# Patient Record
Sex: Female | Born: 1945 | Race: White | Hispanic: No | Marital: Married | Smoking: Never smoker
Health system: Southern US, Community
[De-identification: ages and names within clinical notes are randomized; demographics above are authoritative.]

## PROBLEM LIST (undated history)

## (undated) DIAGNOSIS — I82409 Acute embolism and thrombosis of unspecified deep veins of unspecified lower extremity: Secondary | ICD-10-CM

## (undated) DIAGNOSIS — I10 Essential (primary) hypertension: Secondary | ICD-10-CM

## (undated) DIAGNOSIS — K219 Gastro-esophageal reflux disease without esophagitis: Secondary | ICD-10-CM

## (undated) DIAGNOSIS — I8390 Asymptomatic varicose veins of unspecified lower extremity: Secondary | ICD-10-CM

## (undated) DIAGNOSIS — Z9889 Other specified postprocedural states: Secondary | ICD-10-CM

## (undated) DIAGNOSIS — M109 Gout, unspecified: Secondary | ICD-10-CM

## (undated) DIAGNOSIS — Z8701 Personal history of pneumonia (recurrent): Secondary | ICD-10-CM

## (undated) DIAGNOSIS — C801 Malignant (primary) neoplasm, unspecified: Secondary | ICD-10-CM

## (undated) DIAGNOSIS — R202 Paresthesia of skin: Secondary | ICD-10-CM

## (undated) DIAGNOSIS — H9313 Tinnitus, bilateral: Secondary | ICD-10-CM

## (undated) DIAGNOSIS — R200 Anesthesia of skin: Secondary | ICD-10-CM

## (undated) DIAGNOSIS — R112 Nausea with vomiting, unspecified: Secondary | ICD-10-CM

## (undated) DIAGNOSIS — H33009 Unspecified retinal detachment with retinal break, unspecified eye: Secondary | ICD-10-CM

## (undated) DIAGNOSIS — N23 Unspecified renal colic: Secondary | ICD-10-CM

## (undated) DIAGNOSIS — Z862 Personal history of diseases of the blood and blood-forming organs and certain disorders involving the immune mechanism: Secondary | ICD-10-CM

## (undated) DIAGNOSIS — M549 Dorsalgia, unspecified: Secondary | ICD-10-CM

## (undated) DIAGNOSIS — K649 Unspecified hemorrhoids: Secondary | ICD-10-CM

## (undated) DIAGNOSIS — Z85828 Personal history of other malignant neoplasm of skin: Secondary | ICD-10-CM

## (undated) DIAGNOSIS — M5030 Other cervical disc degeneration, unspecified cervical region: Secondary | ICD-10-CM

## (undated) DIAGNOSIS — Z87442 Personal history of urinary calculi: Secondary | ICD-10-CM

## (undated) HISTORY — DX: Unspecified renal colic: N23

## (undated) HISTORY — DX: Tinnitus, bilateral: H93.13

## (undated) HISTORY — PX: CYSTOSCOPY/RETROGRADE/URETEROSCOPY: SHX5316

## (undated) HISTORY — DX: Essential (primary) hypertension: I10

## (undated) HISTORY — PX: SKIN CANCER EXCISION: SHX779

## (undated) HISTORY — PX: TONSILLECTOMY: SUR1361

## (undated) HISTORY — DX: Personal history of other malignant neoplasm of skin: Z85.828

## (undated) HISTORY — DX: Asymptomatic varicose veins of unspecified lower extremity: I83.90

## (undated) HISTORY — DX: Other cervical disc degeneration, unspecified cervical region: M50.30

## (undated) HISTORY — DX: Unspecified retinal detachment with retinal break, unspecified eye: H33.009

## (undated) HISTORY — PX: EYE SURGERY: SHX253

## (undated) HISTORY — PX: KNEE SURGERY: SHX244

## (undated) HISTORY — PX: CHOLECYSTECTOMY: SHX55

## (undated) HISTORY — PX: OTHER SURGICAL HISTORY: SHX169

## (undated) HISTORY — PX: TUBAL LIGATION: SHX77

## (undated) HISTORY — PX: RETINAL DETACHMENT SURGERY: SHX105

## (undated) HISTORY — PX: NECK SURGERY: SHX720

## (undated) HISTORY — PX: WISDOM TOOTH EXTRACTION: SHX21

## (undated) HISTORY — DX: Dorsalgia, unspecified: M54.9

---

## 1998-08-13 ENCOUNTER — Observation Stay (HOSPITAL_COMMUNITY): Admission: RE | Admit: 1998-08-13 | Discharge: 1998-08-14 | Payer: Self-pay | Admitting: Neurosurgery

## 1999-03-14 ENCOUNTER — Ambulatory Visit (HOSPITAL_COMMUNITY): Admission: RE | Admit: 1999-03-14 | Discharge: 1999-03-14 | Payer: Self-pay | Admitting: Neurosurgery

## 1999-07-14 ENCOUNTER — Encounter: Admission: RE | Admit: 1999-07-14 | Discharge: 1999-07-14 | Payer: Self-pay | Admitting: Neurosurgery

## 1999-07-22 ENCOUNTER — Ambulatory Visit (HOSPITAL_COMMUNITY): Admission: RE | Admit: 1999-07-22 | Discharge: 1999-07-22 | Payer: Self-pay | Admitting: Neurosurgery

## 2000-02-21 ENCOUNTER — Ambulatory Visit (HOSPITAL_COMMUNITY): Admission: RE | Admit: 2000-02-21 | Discharge: 2000-02-21 | Payer: Self-pay | Admitting: *Deleted

## 2000-02-21 ENCOUNTER — Encounter: Payer: Self-pay | Admitting: *Deleted

## 2000-04-28 ENCOUNTER — Encounter: Admission: RE | Admit: 2000-04-28 | Discharge: 2000-04-28 | Payer: Self-pay | Admitting: Neurosurgery

## 2000-12-03 ENCOUNTER — Ambulatory Visit (HOSPITAL_COMMUNITY): Admission: RE | Admit: 2000-12-03 | Discharge: 2000-12-03 | Payer: Self-pay | Admitting: Neurosurgery

## 2004-04-11 ENCOUNTER — Encounter: Payer: Self-pay | Admitting: Psychiatry

## 2004-05-12 ENCOUNTER — Encounter: Payer: Self-pay | Admitting: Psychiatry

## 2004-05-30 ENCOUNTER — Ambulatory Visit (HOSPITAL_COMMUNITY): Admission: RE | Admit: 2004-05-30 | Discharge: 2004-05-30 | Payer: Self-pay | Admitting: Neurosurgery

## 2004-06-11 ENCOUNTER — Ambulatory Visit: Payer: Self-pay | Admitting: Anesthesiology

## 2004-06-18 ENCOUNTER — Ambulatory Visit: Payer: Self-pay | Admitting: Urology

## 2004-07-22 ENCOUNTER — Ambulatory Visit: Payer: Self-pay | Admitting: Unknown Physician Specialty

## 2004-09-08 ENCOUNTER — Ambulatory Visit: Payer: Self-pay | Admitting: Obstetrics and Gynecology

## 2004-11-25 ENCOUNTER — Ambulatory Visit: Payer: Self-pay | Admitting: Anesthesiology

## 2005-02-23 ENCOUNTER — Ambulatory Visit: Payer: Self-pay | Admitting: Anesthesiology

## 2005-04-07 ENCOUNTER — Encounter: Payer: Self-pay | Admitting: Unknown Physician Specialty

## 2005-04-11 ENCOUNTER — Encounter: Payer: Self-pay | Admitting: Unknown Physician Specialty

## 2005-05-12 ENCOUNTER — Encounter: Payer: Self-pay | Admitting: Unknown Physician Specialty

## 2005-12-10 ENCOUNTER — Ambulatory Visit: Payer: Self-pay | Admitting: Unknown Physician Specialty

## 2005-12-16 ENCOUNTER — Ambulatory Visit: Payer: Self-pay | Admitting: Unknown Physician Specialty

## 2006-12-20 ENCOUNTER — Ambulatory Visit: Payer: Self-pay | Admitting: Unknown Physician Specialty

## 2007-03-18 ENCOUNTER — Encounter: Admission: RE | Admit: 2007-03-18 | Discharge: 2007-03-18 | Payer: Self-pay | Admitting: Neurosurgery

## 2007-06-12 ENCOUNTER — Ambulatory Visit (HOSPITAL_COMMUNITY): Admission: RE | Admit: 2007-06-12 | Discharge: 2007-06-13 | Payer: Self-pay | Admitting: Neurosurgery

## 2008-02-01 ENCOUNTER — Ambulatory Visit: Payer: Self-pay | Admitting: Unknown Physician Specialty

## 2008-12-17 ENCOUNTER — Ambulatory Visit: Payer: Self-pay | Admitting: Family Medicine

## 2009-03-04 ENCOUNTER — Ambulatory Visit: Payer: Self-pay | Admitting: Unknown Physician Specialty

## 2009-09-25 ENCOUNTER — Ambulatory Visit: Payer: Self-pay | Admitting: Unknown Physician Specialty

## 2009-12-06 ENCOUNTER — Encounter: Admission: RE | Admit: 2009-12-06 | Discharge: 2009-12-06 | Payer: Self-pay | Admitting: Neurosurgery

## 2010-03-02 ENCOUNTER — Inpatient Hospital Stay (HOSPITAL_COMMUNITY): Admission: RE | Admit: 2010-03-02 | Discharge: 2010-03-06 | Payer: Self-pay | Admitting: Neurosurgery

## 2010-05-06 ENCOUNTER — Ambulatory Visit: Payer: Self-pay | Admitting: Unknown Physician Specialty

## 2010-07-12 HISTORY — PX: BREAST EXCISIONAL BIOPSY: SUR124

## 2010-09-04 ENCOUNTER — Ambulatory Visit: Payer: Self-pay | Admitting: Family Medicine

## 2010-09-25 LAB — DIFFERENTIAL
Basophils Absolute: 0 10*3/uL (ref 0.0–0.1)
Basophils Relative: 0 % (ref 0–1)
Eosinophils Absolute: 0.2 10*3/uL (ref 0.0–0.7)
Eosinophils Relative: 1 % (ref 0–5)
Lymphocytes Relative: 7 % — ABNORMAL LOW (ref 12–46)
Lymphs Abs: 1.1 10*3/uL (ref 0.7–4.0)
Monocytes Absolute: 1 10*3/uL (ref 0.1–1.0)
Monocytes Relative: 6 % (ref 3–12)
Neutro Abs: 13.9 10*3/uL — ABNORMAL HIGH (ref 1.7–7.7)
Neutrophils Relative %: 86 % — ABNORMAL HIGH (ref 43–77)

## 2010-09-25 LAB — BASIC METABOLIC PANEL
BUN: 10 mg/dL (ref 6–23)
BUN: 16 mg/dL (ref 6–23)
CO2: 28 mEq/L (ref 19–32)
CO2: 32 mEq/L (ref 19–32)
Calcium: 7.9 mg/dL — ABNORMAL LOW (ref 8.4–10.5)
Calcium: 9 mg/dL (ref 8.4–10.5)
Chloride: 101 mEq/L (ref 96–112)
Chloride: 99 mEq/L (ref 96–112)
Creatinine, Ser: 0.74 mg/dL (ref 0.4–1.2)
Creatinine, Ser: 0.76 mg/dL (ref 0.4–1.2)
GFR calc Af Amer: >60 mL/min (ref >60)
GFR calc Af Amer: >60 mL/min (ref >60)
GFR calc non Af Amer: >60 mL/min (ref >60)
GFR calc non Af Amer: >60 mL/min (ref >60)
Glucose, Bld: 117 mg/dL — ABNORMAL HIGH (ref 70–99)
Glucose, Bld: 99 mg/dL (ref 70–99)
Potassium: 3.5 mEq/L (ref 3.5–5.1)
Potassium: 3.7 mEq/L (ref 3.5–5.1)
Sodium: 134 mEq/L — ABNORMAL LOW (ref 135–145)
Sodium: 139 mEq/L (ref 135–145)

## 2010-09-25 LAB — CBC
HCT: 35.4 % — ABNORMAL LOW (ref 36.0–46.0)
HCT: 36.6 % (ref 36.0–46.0)
HCT: 43.5 % (ref 36.0–46.0)
Hemoglobin: 11.3 g/dL — ABNORMAL LOW (ref 12.0–15.0)
Hemoglobin: 11.8 g/dL — ABNORMAL LOW (ref 12.0–15.0)
Hemoglobin: 14.3 g/dL (ref 12.0–15.0)
MCH: 30 pg (ref 26.0–34.0)
MCH: 30 pg (ref 26.0–34.0)
MCH: 30.7 pg (ref 26.0–34.0)
MCHC: 31.9 g/dL (ref 30.0–36.0)
MCHC: 32.2 g/dL (ref 30.0–36.0)
MCHC: 32.9 g/dL (ref 30.0–36.0)
MCV: 93.1 fL (ref 78.0–100.0)
MCV: 93.3 fL (ref 78.0–100.0)
MCV: 93.9 fL (ref 78.0–100.0)
Platelets: 204 10*3/uL (ref 150–400)
Platelets: 207 10*3/uL (ref 150–400)
Platelets: 312 10*3/uL (ref 150–400)
RBC: 3.77 MIL/uL — ABNORMAL LOW (ref 3.87–5.11)
RBC: 3.93 MIL/uL (ref 3.87–5.11)
RBC: 4.66 MIL/uL (ref 3.87–5.11)
RDW: 12.7 % (ref 11.5–15.5)
RDW: 12.8 % (ref 11.5–15.5)
RDW: 13.1 % (ref 11.5–15.5)
WBC: 16.2 10*3/uL — ABNORMAL HIGH (ref 4.0–10.5)
WBC: 17.7 10*3/uL — ABNORMAL HIGH (ref 4.0–10.5)
WBC: 9.7 10*3/uL (ref 4.0–10.5)

## 2010-09-25 LAB — CULTURE, BLOOD (ROUTINE X 2): Culture: NO GROWTH

## 2010-09-25 LAB — URINE CULTURE
Colony Count: NO GROWTH
Culture  Setup Time: 201108241839
Culture: NO GROWTH

## 2010-09-25 LAB — URINALYSIS, DIPSTICK ONLY
Bilirubin Urine: NEGATIVE
Glucose, UA: NEGATIVE mg/dL
Hgb urine dipstick: NEGATIVE
Ketones, ur: NEGATIVE mg/dL
Leukocytes, UA: NEGATIVE
Nitrite: NEGATIVE
Protein, ur: NEGATIVE mg/dL
Specific Gravity, Urine: 1.008 (ref 1.005–1.030)
Urobilinogen, UA: 0.2 mg/dL (ref 0.0–1.0)
pH: 8 (ref 5.0–8.0)

## 2010-09-25 LAB — TYPE AND SCREEN
ABO/RH(D): A POS
Antibody Screen: NEGATIVE

## 2010-09-25 LAB — SURGICAL PCR SCREEN
MRSA, PCR: NEGATIVE
Staphylococcus aureus: POSITIVE — AB

## 2010-09-25 LAB — ABO/RH: ABO/RH(D): A POS

## 2010-09-28 ENCOUNTER — Ambulatory Visit: Payer: Self-pay | Admitting: Unknown Physician Specialty

## 2010-10-13 ENCOUNTER — Ambulatory Visit: Payer: Self-pay | Admitting: Surgery

## 2010-10-20 ENCOUNTER — Ambulatory Visit: Payer: Self-pay | Admitting: Surgery

## 2010-10-23 LAB — PATHOLOGY REPORT

## 2010-10-25 ENCOUNTER — Emergency Department: Payer: Self-pay | Admitting: Emergency Medicine

## 2010-11-24 NOTE — Op Note (Signed)
NAMEGIULIANA, [name]                  ACCOUNT NO.:  [id number]   MEDICAL RECORD NO.:  [id number]          PATIENT TYPE:  OIB   LOCATION:  3027                         FACILITY:  [facility]   PHYSICIAN:  Cristi Loron, M.D.DATE OF BIRTH:  05-28-1946   DATE OF PROCEDURE:  06/12/2007  DATE OF DISCHARGE:                               OPERATIVE REPORT   BRIEF HISTORY:  The patient is a 65 year old white female who I  previously performed a C4-C5 and C5-C6 anterior cervical discectomy  fusion and plating on.  The patient has had some chronic neck pain, more  recently has developed increasing neck pain and shoulder pain.  She  failed medical management, was worked up with a cervical MRI which  demonstrated the patient had spondylosis and foraminal stenosis at C3-  C4.  I discussed the various treatment options with the patient  including surgery.  The patient has weighed the risks, benefits, and  alternatives of surgery and decided to proceed with an exploration of  her C4-C5 and C5-C6 fusion, as well as a C3-C4 anterior cervical  discectomy fusion and plating.   PREOPERATIVE DIAGNOSES:  C3-C4 spondylosis, stenosis, cervical  radiculopathy and myelopathy, and cervicalgia.   POSTOPERATIVE DIAGNOSES:  C3-C4 spondylosis, stenosis, cervical  radiculopathy and myelopathy, and cervicalgia.   PROCEDURE:  Exploration of C4-C5 and C5-C6 fusion; C3-C4 extensive  anterior cervical discectomy/decompression; C3-C4 anterior interbody  arthrodesis with local morselized autograft bone and VITOSS bone-graft  extender and insertion of C3-C4 interbody prosthesis (Alphatec PEEK  interbody prosthesis); C3-C4 anterior cervical plating (Codman Slim-Loc  titanium plate and screws).   SURGEON:  Cristi Loron, MD   ASSISTANT:  Stefani Dama, MD   ANESTHESIA:  General endotracheal.   ESTIMATED BLOOD LOSS:  75 mL.   SPECIMENS:  None.   DRAINS:  One 7-mm Blake drain in the prevertebral space.   COMPLICATIONS:  None.   DESCRIPTION OF PROCEDURE:  The patient was brought to the operating room  by the anesthesia team.  General endotracheal anesthesia was induced.  The patient remained in the supine position.  A roll was placed under  her shoulders to place her neck in a slight extension.  Her anterior  cervical region was then prepared with Betadine scrub and Betadine  solution.  Sterile drapes were applied.  The area to be incised was  injected with Marcaine with epinephrine solution.  I used a scalpel to  make an incision through the patient's previous surgical incision site  scar.  I used the Metzenbaum scissors to divide the platysma muscle and  then to dissect medial to the sternocleidomastoid muscle, jugular vein,  and carotid artery.  I carefully dissected down towards the anterior  cervical spine through the previous scar tissue; I carefully identified  the esophagus retracting it medially with the handheld retractors.  I  then used the Kitner swabs to expose the scar tissue over the old  anterior cervical plate.  I then incised the scar tissue over the  anterior cervical plate to expose the cam and screws.  I unlocked the  cam,  remove the screws and removed the plate.   I then inspected the arthrodesis at C4-C5 and C5-C6; I did not see any  evidence of a pseudoarthrosis, i.e. the arthrodesis looked good.   I then used electrocautery to detach the medial border of the longus  colli muscle bilaterally from C3-C4 intervertebral space.  I then  inserted the Caspar self-retaining retractor underneath the longus colli  muscle bilaterally to provide exposure.  We incised the C3-C4  intervertebral disc and performed a partial intervertebral discectomy  using the pituitary forceps and the Carlen curettes.  We then inserted  the distraction screws at C3 and C4, distracted the interspace and then  used a high-speed drill to decorticate the vertebral endplates at C3-C4,  drilled  away the remainder of C3-C4 intervertebral disc to drill away  some posterior spondylosis and then to thin out the posterior  longitudinal ligament.  I then incised the ligament with arachnoid knife  and then removed it with the Kerrison punch undercutting the vertebral  endplates at C3-C4 decompressing the thecal sac.  I then performed the  foraminotomy about the bilateral C4 nerve roots completing the  decompression.   We now turned attention to the arthrodesis.  I used the trial spacers  and determined to use a 7-mm small Alphatec interbody prosthesis.  We  prefilled this prosthesis with a combination of local autograft bone we  obtained during the decompression, as well as VITOSS bone-graft  extender.  We then inserted this prosthesis into the distracted C3-C4  interspace and then removed the distraction screws.  There was a good  snug fit of the prosthesis in the interspace.  We then filled lateral to  the prosthesis with VITOSS completing the arthrodesis.   We now turned attention to the anterior spinal instrumentation.  I used  a high-speed drill to remove some ventral spondylosis from the vertebral  endplates at C3-C4 so that the plate would lay down flat.  We selected  the appropriate length of Codman Slim-Loc anterior cervical plate and  laid it along the anterior aspect of the vertebral bodies at C3-C4.  We  then drilled two 12-mm holes at C3 to C4 and then secured the plate to  the vertebral bodies by placing two 12-mm self-tapping screws at C3 to  C4.  We then obtained intraoperative radiographs that demonstrated good  position of the plate, screws, and interbody prosthesis.  We therefore  secured the screws and plate by locking each cam.   We then obtained hemostasis using bipolar electrocautery.  We irrigated  the wound out with bacitracin solution and then removed the retractor.  We then inspected the esophagus for any damage; there was none apparent.  I then placed a  7-mm flat Blake drain in the prevertebral space and  tunneled out through a separate stab wound.  We then reapproximated the  patient's platysma muscle with interrupted 3-0 Vicryl suture,  subcutaneous tissue with interrupted 3-0 Vicryl suture, and the skin  with Steri-Strips and Benzoin.  The wound was then coated with  bacitracin ointment.  A sterile dressing was applied.  The drapes were  removed.  The patient was subsequently extubated by the anesthesia team  and transported to the post-anesthesia care unit in a stable condition.  All sponge, instrument, and needle counts were correct at the end of  this case.      Cristi Loron, M.D.  Electronically Signed     JDJ/MEDQ  D:  06/12/2007  T:  06/13/2007  Job:  [id number]

## 2010-11-24 NOTE — Op Note (Signed)
NAMEKATERI, BALCH                  ACCOUNT NO.:  [id number]   MEDICAL RECORD NO.:  [id number]          PATIENT TYPE:  OIB   LOCATION:  3027                         FACILITY:  [facility]   PHYSICIAN:  Cristi Loron, M.D.DATE OF BIRTH:  1946-03-31   DATE OF PROCEDURE:  06/12/2007  DATE OF DISCHARGE:                               OPERATIVE REPORT   ADDENDUM:  I mistakenly stated in my operative dictation that we prepared the  patient's anterior neck with Betadine scrub and Betadine solution.  The  patient had an allergy to Betadine, and he was therefore prepared with  Hibiclens.      Cristi Loron, M.D.  Electronically Signed     JDJ/MEDQ  D:  06/12/2007  T:  06/13/2007  Job:  [id number]

## 2011-04-20 LAB — BASIC METABOLIC PANEL
BUN: 12
CO2: 29
Calcium: 9.7
Chloride: 99
Creatinine, Ser: 0.69
GFR calc Af Amer: >60
GFR calc non Af Amer: >60
Glucose, Bld: 97
Potassium: 3.8
Sodium: 137

## 2011-04-20 LAB — CBC
HCT: 41.9
Hemoglobin: 14.1
MCHC: 33.6
MCV: 91.3
Platelets: 360
RBC: 4.59
RDW: 13
WBC: 9.1

## 2011-05-18 ENCOUNTER — Ambulatory Visit: Payer: Self-pay | Admitting: Unknown Physician Specialty

## 2012-05-19 ENCOUNTER — Ambulatory Visit: Payer: Self-pay | Admitting: Obstetrics and Gynecology

## 2012-06-20 DIAGNOSIS — Z85828 Personal history of other malignant neoplasm of skin: Secondary | ICD-10-CM

## 2012-06-20 HISTORY — DX: Personal history of other malignant neoplasm of skin: Z85.828

## 2013-02-20 DIAGNOSIS — C4491 Basal cell carcinoma of skin, unspecified: Secondary | ICD-10-CM

## 2013-02-20 HISTORY — DX: Basal cell carcinoma of skin, unspecified: C44.91

## 2013-04-27 DIAGNOSIS — H33009 Unspecified retinal detachment with retinal break, unspecified eye: Secondary | ICD-10-CM

## 2013-04-27 HISTORY — DX: Unspecified retinal detachment with retinal break, unspecified eye: H33.009

## 2014-09-27 ENCOUNTER — Ambulatory Visit: Payer: Self-pay | Admitting: Unknown Physician Specialty

## 2014-11-04 LAB — SURGICAL PATHOLOGY

## 2014-11-26 DIAGNOSIS — I8390 Asymptomatic varicose veins of unspecified lower extremity: Secondary | ICD-10-CM

## 2014-11-26 DIAGNOSIS — M51379 Other intervertebral disc degeneration, lumbosacral region without mention of lumbar back pain or lower extremity pain: Secondary | ICD-10-CM | POA: Insufficient documentation

## 2014-11-26 DIAGNOSIS — I10 Essential (primary) hypertension: Secondary | ICD-10-CM

## 2014-11-26 DIAGNOSIS — M5137 Other intervertebral disc degeneration, lumbosacral region: Secondary | ICD-10-CM | POA: Insufficient documentation

## 2014-11-26 DIAGNOSIS — M5030 Other cervical disc degeneration, unspecified cervical region: Secondary | ICD-10-CM

## 2014-11-26 DIAGNOSIS — H9313 Tinnitus, bilateral: Secondary | ICD-10-CM

## 2014-11-26 DIAGNOSIS — M549 Dorsalgia, unspecified: Secondary | ICD-10-CM

## 2014-11-26 HISTORY — DX: Essential (primary) hypertension: I10

## 2014-11-26 HISTORY — DX: Tinnitus, bilateral: H93.13

## 2014-11-26 HISTORY — DX: Asymptomatic varicose veins of unspecified lower extremity: I83.90

## 2014-11-26 HISTORY — DX: Dorsalgia, unspecified: M54.9

## 2014-11-26 HISTORY — DX: Other cervical disc degeneration, unspecified cervical region: M50.30

## 2015-02-21 DIAGNOSIS — N23 Unspecified renal colic: Secondary | ICD-10-CM

## 2015-02-21 HISTORY — DX: Unspecified renal colic: N23

## 2015-03-25 ENCOUNTER — Encounter: Payer: Self-pay | Admitting: Urology

## 2015-03-25 ENCOUNTER — Ambulatory Visit (INDEPENDENT_AMBULATORY_CARE_PROVIDER_SITE_OTHER): Payer: Medicare Other | Admitting: Urology

## 2015-03-25 VITALS — BP 112/77 | HR 76 | Ht 66.0 in | Wt 168.8 lb

## 2015-03-25 DIAGNOSIS — N200 Calculus of kidney: Secondary | ICD-10-CM

## 2015-03-25 LAB — MICROSCOPIC EXAMINATION
Bacteria, UA: NONE SEEN
EPITHELIAL CELLS (NON RENAL): NONE SEEN /HPF (ref 0–10)
RBC MICROSCOPIC, UA: NONE SEEN /HPF (ref 0–?)
WBC, UA: NONE SEEN /hpf (ref 0–?)

## 2015-03-25 LAB — URINALYSIS, COMPLETE
BILIRUBIN UA: NEGATIVE
Glucose, UA: NEGATIVE
KETONES UA: NEGATIVE
Leukocytes, UA: NEGATIVE
Nitrite, UA: NEGATIVE
Protein, UA: NEGATIVE
RBC UA: NEGATIVE
Specific Gravity, UA: 1.015 (ref 1.005–1.030)
UUROB: 0.2 mg/dL (ref 0.2–1.0)
pH, UA: 7.5 (ref 5.0–7.5)

## 2015-03-25 NOTE — Progress Notes (Signed)
69 year old white female who presents today for evaluation and management of if her lithiasis.   The patient has a long history of kidney stones , first passed in the early 1990s. She was last seen by Dr. Bernardo Heater in 2005. However, over the interval she has passed numerous stones. Her last stone was likely 6 weeks ago. She describes an episode of several days of right severe flank pain radiating down into the right groin. She subsequently passed 3 or 4 dark spots consistent with stones. The patient has not had any formal stone imaging since 2004. She denies any gross hematuria. She denies any recurrent urinary tract infections, although, does have a history of pyelonephritis. In addition, the patient has urinary urgency and nocturia. She has difficulty starting and stopping her stream and strains to urinate. She also has constipation , easy bruising, restless leg, back and joint pain, and headaches. The patient has had numerous surgeries in the past for stones including ureteroscopy and shockwave lithotripsy. She has a family history consistent with nephrolithiasis. Past Medical History  Diagnosis Date  . Benign essential HTN 11/26/2014  . 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 DDD (degenerative disc disease), cervical 11/26/2014  . Back ache 11/26/2014  . H/O malignant neoplasm of skin 06/20/2012  . Renal colic 5/40/0867  . 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 Bilateral tinnitus 11/26/2014   Past Surgical History  Procedure Laterality Date  . Lower back surgery    . Cholecystectomy    . Tubal ligation    . Knee surgery     No current outpatient prescriptions on file prior to visit.   No current facility-administered medications on file prior to visit.   PE: NAD Filed Vitals:   03/25/15 1035  BP: 112/77  Pulse: 76  ATNC head RRR Non-labored breating Abdomen is soft, no CVA Skin without lesions Normal mood/affect Extremities symmetric No gross neuro deficits  UA- clear  Imp:   The patient has a long history of nephrolithiasis. She is currently asymptomatic. She has not had any formal stone imaging over the last 10 years. She would like to  [place] of preventative strategies and establish care for ongoing stone management.   Plan:  We discussed the various treatment strategies. Ultimately, we settled for a stone protocol CT scan to evaluate her stone burden. We also plan to perform a 24-hour urine collection so that we can work on stone prevention strategies. The patient will obtain a CT scan and a 24-hour urine collection and return to clinic in 1 month.

## 2015-03-25 NOTE — Patient Instructions (Signed)
Complete 24-hour urine collection prior to follow-up.  Complete noncontrast , stone protocol, CT scan prior to follow-up.

## 2015-04-03 ENCOUNTER — Ambulatory Visit
Admission: RE | Admit: 2015-04-03 | Discharge: 2015-04-03 | Disposition: A | Discharge status: Home / Self Care | Payer: Medicare Other | Source: Ambulatory Visit | Attending: Urology | Admitting: Urology

## 2015-04-03 DIAGNOSIS — N200 Calculus of kidney: Secondary | ICD-10-CM | POA: Insufficient documentation

## 2015-04-03 DIAGNOSIS — K5730 Diverticulosis of large intestine without perforation or abscess without bleeding: Secondary | ICD-10-CM | POA: Insufficient documentation

## 2015-04-09 ENCOUNTER — Encounter: Payer: Self-pay | Admitting: Urology

## 2015-04-09 ENCOUNTER — Other Ambulatory Visit: Payer: Self-pay | Admitting: Neurosurgery

## 2015-04-09 DIAGNOSIS — M5416 Radiculopathy, lumbar region: Secondary | ICD-10-CM

## 2015-04-09 NOTE — Progress Notes (Signed)
Patient ID: [name], female   DOB: Nov 06, 1945, 69 y.o.   MRN: [id number]  The patient underwent a 24-hour urine collection to evaluate her risk factors for the formation of kidney stones.  The results were mailed back from the [place] laboratory.  These were not able to be scanned into the chart.  The results are as follows:  Urine volume: 2.34 Supersaturation calcium oxylate: 0.55 Urine calcium: 23 Urine oxylate: 25 Urine citrate: 420 Supersaturation calcium phosphate: 0.16 24-hour urine pH 7.074 Supersaturation uric acid colon 0.04 Urine uric acid colon 0.468  Interpretation of the laboratory results were as follows #1 low urine calcium: Consider bowel disease or intestinal resection, renal insufficiency, very low calcium diet, thiazide diuretic. #2 mild hypocitraturia: If this is only the metabolic defect consider treatment with potassium citrate 20-60 mEq per day and 2-3 doses.  If pH is above 6.3 monitor supersaturation of calcium phosphate, hypercalciuria may need to be treated to avoid calcium phosphate stones.  Hypokalemia can cause hypocitraturia. #3 High urine pH:I urine pH can promote calcium phosphate stones.  When coupled with low urine citrate and citrate or distal renal tubular acidosis.  When using alkali supplements (citrate or bicarbonate) manage urine volume and urine calcium to maintain supersaturation calcium phosphate less than 2.0.  Dietary factors: These were all within normal range with the following exceptions: Sodium-167 (range 15-150) Phosphorus 0.482 (range 0.6-1.2)

## 2015-04-11 ENCOUNTER — Other Ambulatory Visit: Payer: Self-pay | Admitting: Urology

## 2015-04-17 ENCOUNTER — Ambulatory Visit
Admission: RE | Admit: 2015-04-17 | Discharge: 2015-04-17 | Disposition: A | Discharge status: Home / Self Care | Payer: Medicare Other | Source: Ambulatory Visit | Attending: Neurosurgery | Admitting: Neurosurgery

## 2015-04-17 DIAGNOSIS — M2578 Osteophyte, vertebrae: Secondary | ICD-10-CM | POA: Diagnosis not present

## 2015-04-17 DIAGNOSIS — M5126 Other intervertebral disc displacement, lumbar region: Secondary | ICD-10-CM | POA: Insufficient documentation

## 2015-04-17 DIAGNOSIS — M79605 Pain in left leg: Secondary | ICD-10-CM | POA: Diagnosis present

## 2015-04-17 DIAGNOSIS — M6748 Ganglion, other site: Secondary | ICD-10-CM | POA: Diagnosis not present

## 2015-04-17 DIAGNOSIS — M5416 Radiculopathy, lumbar region: Secondary | ICD-10-CM

## 2015-04-22 ENCOUNTER — Ambulatory Visit: Payer: Medicare Other

## 2015-05-06 ENCOUNTER — Encounter: Payer: Self-pay | Admitting: Urology

## 2015-05-06 ENCOUNTER — Ambulatory Visit (INDEPENDENT_AMBULATORY_CARE_PROVIDER_SITE_OTHER): Payer: Medicare Other | Admitting: Urology

## 2015-05-06 VITALS — BP 123/75 | HR 80 | Ht 66.0 in | Wt 167.6 lb

## 2015-05-06 DIAGNOSIS — N200 Calculus of kidney: Secondary | ICD-10-CM | POA: Diagnosis not present

## 2015-05-06 NOTE — Progress Notes (Signed)
69 year old white female who presents today for evaluation and management of if her lithiasis.   The patient has a long history of kidney stones , first passed in the early 1990s. She was last seen by Dr. Bernardo Heater in 2005.  Her last stone was likely in the summer of 2016. She denies any gross hematuria.  She denies any recurrent urinary tract infections, although, does have a history of pyelonephritis.  The patient has had numerous surgeries in the past for stones including ureteroscopy and shockwave lithotripsy.   She has a family history consistent with nephrolithiasis. Intv: Patient presents today for follow-up after undergoing a CT scan and a 24-hour urine analysis. Her urinalysis essentially revealed mild hypocitraturia and an elevated PTH. Her urine volume was 2.3 L. Her CT scan demonstrated no evidence of stones. The patient has been seen and evaluated by neurosurgery, she reports that she has a cyst that is impinging on her spinal column and will ultimately require surgery. The patient denies any progression of her voiding symptoms. She denies any hematuria or flank pain.  Past Medical History  Diagnosis Date  . Benign essential HTN 11/26/2014  . 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 DDD (degenerative disc disease), cervical 11/26/2014  . Back ache 11/26/2014  . H/O malignant neoplasm of skin 06/20/2012  . Renal colic 3/84/5364  . 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 Bilateral tinnitus 11/26/2014   Past Surgical History  Procedure Laterality Date  . Lower back surgery    . Cholecystectomy    . Tubal ligation    . Knee surgery     Current Outpatient Prescriptions on File Prior to Visit  Medication Sig Dispense Refill  . amLODipine-benazepril (LOTREL) 5-10 MG per capsule Take by mouth.    . Cholecalciferol (VITAMIN D-1000 MAX ST) 1000 UNITS tablet Take by mouth.    . cyclobenzaprine (FLEXERIL) 10 MG tablet Take by mouth.    . DHA-EPA-VITAMIN E PO Take by mouth.    . Flaxseed,  Linseed, (FLAXSEED OIL) 1000 MG CAPS Take by mouth.    . gabapentin (NEURONTIN) 400 MG capsule     . Garlic 6803 MG CAPS Take by mouth.    . hydrochlorothiazide (HYDRODIURIL) 25 MG tablet Take 25 mg by mouth.    Marland Kitchen HYDROcodone-acetaminophen (NORCO/VICODIN) 5-325 MG per tablet     . Multiple Vitamin (MULTI-VITAMINS) TABS Take by mouth.    Marland Kitchen NATURAL PSYLLIUM SEED PO Take by mouth.    . Omega-3 1000 MG CAPS Take by mouth.    . cephALEXin (KEFLEX) 500 MG capsule Take 500 mg by mouth.    . triamterene-hydrochlorothiazide (MAXZIDE-25) 37.5-25 MG per tablet Take by mouth.     No current facility-administered medications on file prior to visit.   PE: NAD Filed Vitals:   05/06/15 0928  BP: 123/75  Pulse: 80  ATNC head Non-labored breating Skin without lesions Normal mood/affect No gross neuro deficits  UA- unable to obtain Imaging: Stone protocol CT - no stones.  Imp:  The patient has a long history of nephrolithiasis. She is currently asymptomatic.  Her imaging revealed no evidence of kidney stones currently. Her 24-hour urine evaluation demonstrated mild hypocitraturia and an elevated pH. Plan:  I discussed the CT scan as well as a 24-hour urine results with the patient. These results are quite encouraging. She should name to obtain 2.5 L of urine per day, she is close to this already. In addition, she should include more lemons, lemonade, and citric fruits within her diet.  This will elevate her urinary citrate. He will also likely lower her pH. Otherwise, the patient does not need his significant alterations or medications for her kidney stones. Given that she has no additional stones today, there is no need for additional follow-up. I was plans the patient on an as-needed basis.

## 2015-05-08 ENCOUNTER — Other Ambulatory Visit: Payer: Self-pay | Admitting: Neurosurgery

## 2015-06-03 ENCOUNTER — Encounter (HOSPITAL_COMMUNITY): Payer: Self-pay

## 2015-06-03 ENCOUNTER — Encounter (HOSPITAL_COMMUNITY)
Admission: RE | Admit: 2015-06-03 | Discharge: 2015-06-03 | Disposition: A | Discharge status: Home / Self Care | Payer: Medicare Other | Source: Ambulatory Visit | Attending: Neurosurgery | Admitting: Neurosurgery

## 2015-06-03 ENCOUNTER — Other Ambulatory Visit: Payer: Self-pay | Admitting: Physician Assistant

## 2015-06-03 DIAGNOSIS — Z79899 Other long term (current) drug therapy: Secondary | ICD-10-CM | POA: Diagnosis not present

## 2015-06-03 DIAGNOSIS — Z01818 Encounter for other preprocedural examination: Secondary | ICD-10-CM | POA: Insufficient documentation

## 2015-06-03 DIAGNOSIS — I10 Essential (primary) hypertension: Secondary | ICD-10-CM | POA: Diagnosis not present

## 2015-06-03 DIAGNOSIS — M25571 Pain in right ankle and joints of right foot: Secondary | ICD-10-CM

## 2015-06-03 DIAGNOSIS — I444 Left anterior fascicular block: Secondary | ICD-10-CM | POA: Diagnosis not present

## 2015-06-03 DIAGNOSIS — Z86718 Personal history of other venous thrombosis and embolism: Secondary | ICD-10-CM | POA: Diagnosis not present

## 2015-06-03 DIAGNOSIS — Z01812 Encounter for preprocedural laboratory examination: Secondary | ICD-10-CM | POA: Diagnosis not present

## 2015-06-03 DIAGNOSIS — Z0183 Encounter for blood typing: Secondary | ICD-10-CM | POA: Diagnosis not present

## 2015-06-03 DIAGNOSIS — M4806 Spinal stenosis, lumbar region: Secondary | ICD-10-CM | POA: Insufficient documentation

## 2015-06-03 HISTORY — DX: Other specified postprocedural states: Z98.890

## 2015-06-03 HISTORY — DX: Nausea with vomiting, unspecified: R11.2

## 2015-06-03 HISTORY — DX: Paresthesia of skin: R20.2

## 2015-06-03 HISTORY — DX: Unspecified hemorrhoids: K64.9

## 2015-06-03 HISTORY — DX: Personal history of pneumonia (recurrent): Z87.01

## 2015-06-03 HISTORY — DX: Acute embolism and thrombosis of unspecified deep veins of unspecified lower extremity: I82.409

## 2015-06-03 HISTORY — DX: Anesthesia of skin: R20.0

## 2015-06-03 HISTORY — DX: Personal history of urinary calculi: Z87.442

## 2015-06-03 HISTORY — DX: Gout, unspecified: M10.9

## 2015-06-03 HISTORY — DX: Personal history of diseases of the blood and blood-forming organs and certain disorders involving the immune mechanism: Z86.2

## 2015-06-03 HISTORY — DX: Malignant (primary) neoplasm, unspecified: C80.1

## 2015-06-03 LAB — CBC
HEMATOCRIT: 43.2 % (ref 36.0–46.0)
Hemoglobin: 13.8 g/dL (ref 12.0–15.0)
MCH: 30.4 pg (ref 26.0–34.0)
MCHC: 31.9 g/dL (ref 30.0–36.0)
MCV: 95.2 fL (ref 78.0–100.0)
Platelets: 316 10*3/uL (ref 150–400)
RBC: 4.54 MIL/uL (ref 3.87–5.11)
RDW: 13 % (ref 11.5–15.5)
WBC: 7.9 10*3/uL (ref 4.0–10.5)

## 2015-06-03 LAB — TYPE AND SCREEN
ABO/RH(D): A POS
Antibody Screen: NEGATIVE

## 2015-06-03 LAB — SURGICAL PCR SCREEN
MRSA, PCR: NEGATIVE
Staphylococcus aureus: POSITIVE — AB

## 2015-06-03 NOTE — Progress Notes (Signed)
PCP - Dr. Candiss Norse at [facility] Cardiologist - denies  EKG - 06/03/15 CXR - denies  Echo- denies Stress test - requested - pt. Believes 3 years ago Cardiac Cath - denies  Patient denies chest pain and shortness of breath at PAT appointment.

## 2015-06-03 NOTE — Progress Notes (Signed)
Patient states that she has had swelling and redness in her right lower leg and ankle with severe pain.  Patient visited her PCP on 06/02/15 and was started on gout medication and had blood work and an Insurance account manager completed.  Patient has a history of a DVT.  Patient is concerned that it is another blood clot.    Patient contacted her PCP while in PAT appointment to address concern of another blood clot and to inform PCP that she has an upcoming surgery.  PCP's office stated that they had left a message on her voicemail at home regarding her results from yesterday but the MD was not available to talk to her at the moment.  Secretary stated that someone would return phone call today.   Patient instructed to inform Dr. Adline Mango office with results.  Patient also instructed to follow-up with PAT nurse.

## 2015-06-03 NOTE — Progress Notes (Signed)
I called a prescription for Mupirocin ointment to [facility], [employer], [place].

## 2015-06-03 NOTE — Pre-Procedure Instructions (Signed)
    ANALIE KICE  06/03/2015     No Pharmacies Listed   Your procedure is scheduled on Wednesday, November 30th, 2016.  Report to [facility] Admitting at 6:30 A.M.  Call this number if you have problems the morning of surgery:  [phone]   Remember:  Do not eat food or drink liquids after midnight.  Take these medicines the morning of surgery with A SIP OF WATER: Colchicine, Gabapentin (Neurontin), Hydrocodone-acetaminophen (Norco/Vicodin).    7 days prior to surgery, stop taking: Aspirin, NSAIDS, Aleve, Naproxen, Ibuprofen, Advil, Motrin, BC's, Goody's, Fish oil, all herbal medications, and all vitamins.    Do not wear jewelry, make-up or nail polish.  Do not wear lotions, powders, or perfumes.  You may wear deodorant.  Do not shave 48 hours prior to surgery.    Do not bring valuables to the hospital.  [facility] is not responsible for any belongings or valuables.  Contacts, dentures or bridgework may not be worn into surgery.  Leave your suitcase in the car.  After surgery it may be brought to your room.  For patients admitted to the hospital, discharge time will be determined by your treatment team.  Patients discharged the day of surgery will not be allowed to drive home.   Special instructions:  See attached.   Please read over the following fact sheets that you were given. Pain Booklet, Coughing and Deep Breathing, MRSA Information and Surgical Site Infection Prevention

## 2015-06-04 NOTE — Progress Notes (Addendum)
Anesthesia Chart Review: Patient is a 69 year old female scheduled for L3-4 PLIF on 06/11/15 by Dr. Arnoldo Morale.  History includes non-smoker, HTN, post-operative N/V, post-operative RLE DVT, skin cancer (BCC), anemia, tinnitus, cholecystectomy, tonsillectomy, retinal detachment surgery, neck surgery X 4, back surgery. PCP is Dr. Glendon Axe with [facility]. She is not routinely followed by a cardiologist, but was seen by Dr. Serafina Royals in 07/2012 for episode of heart pounding, chest pain with abnormal EKG (R BBB by notes). She had a normal stress echo, and her symptoms resolved. PRN cardiology follow-up recommended.   She was seen on 06/02/15 for right foot pain with redness and swelling. Unsure about injury. Treated for possible gout, but uric acid level came back normal. Xray was ordered, but I don't have access to report. Patient concerned that she could have a DVT and wanted it evaluated prior to surgery. She was advised to contact her PCP, and now she is scheduled for a RLE venous U/S on 06/06/15.   Meds include amlodipine-benazepril, Neurontin, HCTZ, omega-3, Maxzide-25.   06/03/15 EKG: NSR, LAFB. LAD is more apparent and negative T wave in III new since 10/13/10 tracing. She had findings of LAFB and cannot rule out anterior infarct dating back to tracing on 06/09/07 (see [place]).   07/19/12 Stress echocardiogram [facility]): Conclusion:  1. Normal treadmill ECG without evidence of ischemia or dysrhythmia. 2. Average exercise tolerance for age. 3. Normal stress echocardiographic images without evidence of ischemia.  Preoperative labs noted. Her BMET was done on 06/02/15 at [facility] (see [place]) and showed a glucose 110, Na 138, K 3.8, BUN 16, Cr 0.7, Ca 9.4. Uric acid normal at 6.3. CBC on 06/03/15 was WNL. T&S done.  She is getting a RLE venous duplex on Friday. Will leave chart for follow-up results. If negative for DVT and still with erythema then she will still need to  be in contact with her PCP and Dr. Arnoldo Morale for re-evaluation as differential could also include cellulitis.  George Hugh [facility] Short Stay Center/Anesthesiology Phone [phone] 06/04/2015 5:04 PM  Addendum: 06/06/15 RLE venous duplex: IMPRESSION: No evidence of deep venous thrombosis.   I notified Janett Billow at Dr. Arnoldo Morale' office so they could follow-up with patient to see if her right foot symptoms had resolved.   George Hugh [facility] Short Stay Center/Anesthesiology Phone [phone] 06/09/2015 2:42 PM

## 2015-06-06 ENCOUNTER — Ambulatory Visit
Admission: RE | Admit: 2015-06-06 | Discharge: 2015-06-06 | Disposition: A | Discharge status: Home / Self Care | Payer: Medicare Other | Source: Ambulatory Visit | Attending: Physician Assistant | Admitting: Physician Assistant

## 2015-06-06 DIAGNOSIS — M25571 Pain in right ankle and joints of right foot: Secondary | ICD-10-CM | POA: Diagnosis present

## 2015-06-06 DIAGNOSIS — M7989 Other specified soft tissue disorders: Secondary | ICD-10-CM | POA: Insufficient documentation

## 2015-06-10 MED ORDER — CEFAZOLIN SODIUM-DEXTROSE 2-3 GM-% IV SOLR
2.0000 g | INTRAVENOUS | Status: AC
  Administered 2015-06-11: 2 g via INTRAVENOUS
  Filled 2015-06-10: qty 50

## 2015-06-11 ENCOUNTER — Inpatient Hospital Stay (HOSPITAL_COMMUNITY): Payer: Medicare Other

## 2015-06-11 ENCOUNTER — Encounter (HOSPITAL_COMMUNITY)
Admission: RE | Disposition: A | Discharge status: Home / Self Care | Payer: Medicare Other | Source: Ambulatory Visit | Attending: Neurosurgery

## 2015-06-11 ENCOUNTER — Inpatient Hospital Stay (HOSPITAL_COMMUNITY)
Admission: RE | Admit: 2015-06-11 | Discharge: 2015-06-13 | DRG: 460 | Disposition: A | Discharge status: Home / Self Care | Payer: Medicare Other | Source: Ambulatory Visit | Attending: Neurosurgery | Admitting: Neurosurgery

## 2015-06-11 ENCOUNTER — Inpatient Hospital Stay (HOSPITAL_COMMUNITY): Payer: Medicare Other | Admitting: Certified Registered"

## 2015-06-11 ENCOUNTER — Encounter (HOSPITAL_COMMUNITY): Payer: Self-pay | Admitting: *Deleted

## 2015-06-11 ENCOUNTER — Inpatient Hospital Stay (HOSPITAL_COMMUNITY): Payer: Medicare Other | Admitting: Vascular Surgery

## 2015-06-11 DIAGNOSIS — M4806 Spinal stenosis, lumbar region: Secondary | ICD-10-CM | POA: Diagnosis present

## 2015-06-11 DIAGNOSIS — I10 Essential (primary) hypertension: Secondary | ICD-10-CM | POA: Diagnosis present

## 2015-06-11 DIAGNOSIS — Z86718 Personal history of other venous thrombosis and embolism: Secondary | ICD-10-CM

## 2015-06-11 DIAGNOSIS — M5116 Intervertebral disc disorders with radiculopathy, lumbar region: Secondary | ICD-10-CM | POA: Diagnosis present

## 2015-06-11 DIAGNOSIS — Z85828 Personal history of other malignant neoplasm of skin: Secondary | ICD-10-CM | POA: Diagnosis not present

## 2015-06-11 DIAGNOSIS — M7130 Other bursal cyst, unspecified site: Secondary | ICD-10-CM | POA: Diagnosis present

## 2015-06-11 DIAGNOSIS — M48062 Spinal stenosis, lumbar region with neurogenic claudication: Secondary | ICD-10-CM | POA: Diagnosis present

## 2015-06-11 DIAGNOSIS — Z79899 Other long term (current) drug therapy: Secondary | ICD-10-CM

## 2015-06-11 DIAGNOSIS — Z419 Encounter for procedure for purposes other than remedying health state, unspecified: Secondary | ICD-10-CM

## 2015-06-11 DIAGNOSIS — M79606 Pain in leg, unspecified: Secondary | ICD-10-CM | POA: Diagnosis present

## 2015-06-11 SURGERY — POSTERIOR LUMBAR FUSION 1 WITH HARDWARE REMOVAL
Anesthesia: General

## 2015-06-11 MED ORDER — MORPHINE SULFATE (PF) 2 MG/ML IV SOLN: 1.0000 mg | INTRAVENOUS | Status: DC | PRN

## 2015-06-11 MED ORDER — HYDROCHLOROTHIAZIDE 25 MG PO TABS: 12.5000 mg | ORAL_TABLET | Freq: Every day | ORAL | Status: DC

## 2015-06-11 MED ORDER — FENTANYL CITRATE (PF) 100 MCG/2ML IJ SOLN
INTRAMUSCULAR | Status: DC | PRN
  Administered 2015-06-11: 100 ug via INTRAVENOUS
  Administered 2015-06-11: 50 ug via INTRAVENOUS
  Administered 2015-06-11: 100 ug via INTRAVENOUS

## 2015-06-11 MED ORDER — DEXAMETHASONE SODIUM PHOSPHATE 4 MG/ML IJ SOLN
INTRAMUSCULAR | Status: DC | PRN
  Administered 2015-06-11: 4 mg via INTRAVENOUS

## 2015-06-11 MED ORDER — HYDROMORPHONE HCL 1 MG/ML IJ SOLN
INTRAMUSCULAR | Status: AC
  Filled 2015-06-11: qty 1

## 2015-06-11 MED ORDER — TRIAMTERENE-HCTZ 37.5-25 MG PO TABS
1.0000 | ORAL_TABLET | Freq: Every day | ORAL | Status: DC
  Administered 2015-06-12: 1 via ORAL
  Filled 2015-06-11 (×3): qty 1

## 2015-06-11 MED ORDER — PNEUMOCOCCAL VAC POLYVALENT 25 MCG/0.5ML IJ INJ
0.5000 mL | INJECTION | INTRAMUSCULAR | Status: DC
  Filled 2015-06-11: qty 0.5

## 2015-06-11 MED ORDER — SUCCINYLCHOLINE CHLORIDE 20 MG/ML IJ SOLN
INTRAMUSCULAR | Status: AC
  Filled 2015-06-11: qty 1

## 2015-06-11 MED ORDER — HYDROCODONE-ACETAMINOPHEN 5-325 MG PO TABS: 1.0000 | ORAL_TABLET | ORAL | Status: DC | PRN

## 2015-06-11 MED ORDER — MIDAZOLAM HCL 5 MG/5ML IJ SOLN
INTRAMUSCULAR | Status: DC | PRN
  Administered 2015-06-11: 2 mg via INTRAVENOUS

## 2015-06-11 MED ORDER — EPHEDRINE SULFATE 50 MG/ML IJ SOLN
INTRAMUSCULAR | Status: DC | PRN
  Administered 2015-06-11: 20 mg via INTRAVENOUS
  Administered 2015-06-11: 10 mg via INTRAVENOUS

## 2015-06-11 MED ORDER — ONDANSETRON HCL 4 MG/2ML IJ SOLN: 4.0000 mg | Freq: Once | INTRAMUSCULAR | Status: DC | PRN

## 2015-06-11 MED ORDER — POLYVINYL ALCOHOL 1.4 % OP SOLN
1.0000 [drp] | OPHTHALMIC | Status: DC | PRN
  Filled 2015-06-11: qty 15

## 2015-06-11 MED ORDER — LACTATED RINGERS IV SOLN
INTRAVENOUS | Status: DC
  Administered 2015-06-11 (×4): via INTRAVENOUS

## 2015-06-11 MED ORDER — ACETAMINOPHEN 325 MG PO TABS: 650.0000 mg | ORAL_TABLET | ORAL | Status: DC | PRN

## 2015-06-11 MED ORDER — LIDOCAINE HCL (CARDIAC) 20 MG/ML IV SOLN
INTRAVENOUS | Status: AC
  Filled 2015-06-11: qty 5

## 2015-06-11 MED ORDER — BISACODYL 10 MG RE SUPP
10.0000 mg | Freq: Every day | RECTAL | Status: DC | PRN
Start: 2015-06-11 — End: 2015-06-13

## 2015-06-11 MED ORDER — STERILE WATER FOR INJECTION IJ SOLN
INTRAMUSCULAR | Status: AC
  Filled 2015-06-11: qty 20

## 2015-06-11 MED ORDER — OXYCODONE-ACETAMINOPHEN 5-325 MG PO TABS
1.0000 | ORAL_TABLET | ORAL | Status: DC | PRN
  Administered 2015-06-11: 2 via ORAL
  Administered 2015-06-12 (×3): 1 via ORAL
  Administered 2015-06-12 – 2015-06-13 (×2): 2 via ORAL
  Administered 2015-06-13: 1 via ORAL
  Filled 2015-06-11: qty 1
  Filled 2015-06-11: qty 2
  Filled 2015-06-11: qty 1
  Filled 2015-06-11: qty 2
  Filled 2015-06-11 (×2): qty 1
  Filled 2015-06-11: qty 2

## 2015-06-11 MED ORDER — ROCURONIUM BROMIDE 50 MG/5ML IV SOLN
INTRAVENOUS | Status: AC
  Filled 2015-06-11: qty 1

## 2015-06-11 MED ORDER — CEFAZOLIN SODIUM-DEXTROSE 2-3 GM-% IV SOLR
2.0000 g | Freq: Three times a day (TID) | INTRAVENOUS | Status: AC
Start: 2015-06-11 — End: 2015-06-12
  Administered 2015-06-11 – 2015-06-12 (×2): 2 g via INTRAVENOUS
  Filled 2015-06-11 (×2): qty 50

## 2015-06-11 MED ORDER — DIAZEPAM 5 MG PO TABS
ORAL_TABLET | ORAL | Status: AC
  Administered 2015-06-11: 5 mg via ORAL
  Filled 2015-06-11: qty 1

## 2015-06-11 MED ORDER — BUPIVACAINE-EPINEPHRINE (PF) 0.5% -1:200000 IJ SOLN
INTRAMUSCULAR | Status: DC | PRN
  Administered 2015-06-11: 10 mL via PERINEURAL

## 2015-06-11 MED ORDER — MIDAZOLAM HCL 2 MG/2ML IJ SOLN
INTRAMUSCULAR | Status: AC
  Filled 2015-06-11: qty 2

## 2015-06-11 MED ORDER — THROMBIN 20000 UNITS EX SOLR
CUTANEOUS | Status: DC | PRN
  Administered 2015-06-11: 11:00:00 via TOPICAL

## 2015-06-11 MED ORDER — MENTHOL 3 MG MT LOZG: 1.0000 | LOZENGE | OROMUCOSAL | Status: DC | PRN

## 2015-06-11 MED ORDER — BENAZEPRIL HCL 10 MG PO TABS
10.0000 mg | ORAL_TABLET | Freq: Every day | ORAL | Status: DC
  Administered 2015-06-12: 10 mg via ORAL
  Filled 2015-06-11 (×2): qty 1

## 2015-06-11 MED ORDER — VANCOMYCIN HCL 1000 MG IV SOLR
INTRAVENOUS | Status: DC | PRN
  Administered 2015-06-11: 1000 mg via TOPICAL

## 2015-06-11 MED ORDER — 0.9 % SODIUM CHLORIDE (POUR BTL) OPTIME
TOPICAL | Status: DC | PRN
  Administered 2015-06-11: 1000 mL

## 2015-06-11 MED ORDER — ALUM & MAG HYDROXIDE-SIMETH 200-200-20 MG/5ML PO SUSP: 30.0000 mL | Freq: Four times a day (QID) | ORAL | Status: DC | PRN

## 2015-06-11 MED ORDER — ONDANSETRON HCL 4 MG/2ML IJ SOLN
INTRAMUSCULAR | Status: DC | PRN
  Administered 2015-06-11: 4 mg via INTRAVENOUS

## 2015-06-11 MED ORDER — GABAPENTIN 400 MG PO CAPS: 400.0000 mg | ORAL_CAPSULE | Freq: Three times a day (TID) | ORAL | Status: DC

## 2015-06-11 MED ORDER — PROPOFOL 10 MG/ML IV BOLUS
INTRAVENOUS | Status: AC
  Filled 2015-06-11: qty 40

## 2015-06-11 MED ORDER — COLCHICINE 0.6 MG PO TABS
0.6000 mg | ORAL_TABLET | Freq: Every day | ORAL | Status: DC | PRN
  Filled 2015-06-11: qty 1

## 2015-06-11 MED ORDER — SUGAMMADEX SODIUM 500 MG/5ML IV SOLN
INTRAVENOUS | Status: AC
  Filled 2015-06-11: qty 5

## 2015-06-11 MED ORDER — DEXAMETHASONE SODIUM PHOSPHATE 4 MG/ML IJ SOLN
INTRAMUSCULAR | Status: AC
  Filled 2015-06-11: qty 1

## 2015-06-11 MED ORDER — GABAPENTIN 400 MG PO CAPS
800.0000 mg | ORAL_CAPSULE | Freq: Every day | ORAL | Status: DC
  Administered 2015-06-11 – 2015-06-12 (×2): 800 mg via ORAL
  Filled 2015-06-11 (×2): qty 2

## 2015-06-11 MED ORDER — DEXAMETHASONE SODIUM PHOSPHATE 4 MG/ML IJ SOLN
INTRAMUSCULAR | Status: AC
  Filled 2015-06-11: qty 2

## 2015-06-11 MED ORDER — ROCURONIUM BROMIDE 100 MG/10ML IV SOLN
INTRAVENOUS | Status: DC | PRN
  Administered 2015-06-11: 50 mg via INTRAVENOUS
  Administered 2015-06-11: 20 mg via INTRAVENOUS

## 2015-06-11 MED ORDER — ONDANSETRON HCL 4 MG/2ML IJ SOLN: 4.0000 mg | INTRAMUSCULAR | Status: DC | PRN

## 2015-06-11 MED ORDER — AMLODIPINE BESY-BENAZEPRIL HCL 5-10 MG PO CAPS: 1.0000 | ORAL_CAPSULE | Freq: Every day | ORAL | Status: DC

## 2015-06-11 MED ORDER — BACITRACIN ZINC 500 UNIT/GM EX OINT
TOPICAL_OINTMENT | CUTANEOUS | Status: DC | PRN
  Administered 2015-06-11: 1 via TOPICAL

## 2015-06-11 MED ORDER — FENTANYL CITRATE (PF) 250 MCG/5ML IJ SOLN
INTRAMUSCULAR | Status: AC
  Filled 2015-06-11: qty 5

## 2015-06-11 MED ORDER — HYDROMORPHONE HCL 1 MG/ML IJ SOLN
INTRAMUSCULAR | Status: AC
  Administered 2015-06-11: 0.5 mg via INTRAVENOUS
  Filled 2015-06-11: qty 1

## 2015-06-11 MED ORDER — SUGAMMADEX SODIUM 200 MG/2ML IV SOLN
INTRAVENOUS | Status: AC
  Filled 2015-06-11: qty 2

## 2015-06-11 MED ORDER — PHENYLEPHRINE HCL 10 MG/ML IJ SOLN
INTRAMUSCULAR | Status: DC | PRN
  Administered 2015-06-11 (×5): 80 ug via INTRAVENOUS

## 2015-06-11 MED ORDER — PHENOL 1.4 % MT LIQD: 1.0000 | OROMUCOSAL | Status: DC | PRN

## 2015-06-11 MED ORDER — ARTIFICIAL TEARS OP OINT
TOPICAL_OINTMENT | OPHTHALMIC | Status: AC
  Filled 2015-06-11: qty 3.5

## 2015-06-11 MED ORDER — GABAPENTIN 400 MG PO CAPS
400.0000 mg | ORAL_CAPSULE | Freq: Two times a day (BID) | ORAL | Status: DC
  Administered 2015-06-12 (×2): 400 mg via ORAL
  Filled 2015-06-11 (×3): qty 1

## 2015-06-11 MED ORDER — VANCOMYCIN HCL 1000 MG IV SOLR
INTRAVENOUS | Status: AC
  Filled 2015-06-11: qty 1000

## 2015-06-11 MED ORDER — AMLODIPINE BESYLATE 5 MG PO TABS
5.0000 mg | ORAL_TABLET | Freq: Every day | ORAL | Status: DC
  Administered 2015-06-12: 5 mg via ORAL
  Filled 2015-06-11 (×2): qty 1

## 2015-06-11 MED ORDER — ONDANSETRON HCL 4 MG/2ML IJ SOLN
INTRAMUSCULAR | Status: AC
  Filled 2015-06-11: qty 2

## 2015-06-11 MED ORDER — HYDROMORPHONE HCL 1 MG/ML IJ SOLN
0.2500 mg | INTRAMUSCULAR | Status: DC | PRN
  Administered 2015-06-11 (×3): 0.5 mg via INTRAVENOUS

## 2015-06-11 MED ORDER — SUGAMMADEX SODIUM 200 MG/2ML IV SOLN
INTRAVENOUS | Status: DC | PRN
  Administered 2015-06-11: 155 mg via INTRAVENOUS

## 2015-06-11 MED ORDER — DOCUSATE SODIUM 100 MG PO CAPS
100.0000 mg | ORAL_CAPSULE | Freq: Two times a day (BID) | ORAL | Status: DC
  Administered 2015-06-11 – 2015-06-12 (×3): 100 mg via ORAL
  Filled 2015-06-11 (×2): qty 1

## 2015-06-11 MED ORDER — LACTATED RINGERS IV SOLN: INTRAVENOUS | Status: DC

## 2015-06-11 MED ORDER — MEPERIDINE HCL 25 MG/ML IJ SOLN: 6.2500 mg | INTRAMUSCULAR | Status: DC | PRN

## 2015-06-11 MED ORDER — EPHEDRINE SULFATE 50 MG/ML IJ SOLN
INTRAMUSCULAR | Status: AC
  Filled 2015-06-11: qty 2

## 2015-06-11 MED ORDER — BUPIVACAINE LIPOSOME 1.3 % IJ SUSP
INTRAMUSCULAR | Status: DC | PRN
  Administered 2015-06-11: 20 mL

## 2015-06-11 MED ORDER — BUPIVACAINE LIPOSOME 1.3 % IJ SUSP
20.0000 mL | INTRAMUSCULAR | Status: AC
  Filled 2015-06-11: qty 20

## 2015-06-11 MED ORDER — PROPOFOL 10 MG/ML IV BOLUS
INTRAVENOUS | Status: DC | PRN
  Administered 2015-06-11: 150 mg via INTRAVENOUS

## 2015-06-11 MED ORDER — ACETAMINOPHEN 650 MG RE SUPP: 650.0000 mg | RECTAL | Status: DC | PRN

## 2015-06-11 MED ORDER — LIDOCAINE HCL (CARDIAC) 20 MG/ML IV SOLN
INTRAVENOUS | Status: DC | PRN
  Administered 2015-06-11: 100 mg via INTRAVENOUS

## 2015-06-11 MED ORDER — DIAZEPAM 5 MG PO TABS
5.0000 mg | ORAL_TABLET | Freq: Four times a day (QID) | ORAL | Status: DC | PRN
  Administered 2015-06-11 – 2015-06-12 (×2): 5 mg via ORAL
  Filled 2015-06-11: qty 1

## 2015-06-11 MED ORDER — SODIUM CHLORIDE 0.9 % IR SOLN
Status: DC | PRN
  Administered 2015-06-11: 11:00:00

## 2015-06-11 MED ORDER — LIDOCAINE HCL (CARDIAC) 20 MG/ML IV SOLN
INTRAVENOUS | Status: AC
  Filled 2015-06-11: qty 10

## 2015-06-11 MED FILL — Sodium Chloride IV Soln 0.9%: INTRAVENOUS | Qty: 1000 | Status: AC

## 2015-06-11 MED FILL — Heparin Sodium (Porcine) Inj 1000 Unit/ML: INTRAMUSCULAR | Qty: 30 | Status: AC

## 2015-06-11 SURGICAL SUPPLY — 65 items
APL SKNCLS STERI-STRIP NONHPOA (GAUZE/BANDAGES/DRESSINGS) ×1
BAG DECANTER FOR FLEXI CONT (MISCELLANEOUS) ×3 IMPLANT
BENZOIN TINCTURE PRP APPL 2/3 (GAUZE/BANDAGES/DRESSINGS) ×3 IMPLANT
BLADE CLIPPER SURG (BLADE) IMPLANT
BRUSH SCRUB EZ PLAIN DRY (MISCELLANEOUS) ×3 IMPLANT
BUR MATCHSTICK NEURO 3.0 LAGG (BURR) ×3 IMPLANT
BUR PRECISION FLUTE 6.0 (BURR) ×3 IMPLANT
CANISTER SUCT 3000ML PPV (MISCELLANEOUS) ×3 IMPLANT
CLOSURE WOUND 1/2 X4 (GAUZE/BANDAGES/DRESSINGS) ×1
CONT SPEC 4OZ CLIKSEAL STRL BL (MISCELLANEOUS) ×3 IMPLANT
COVER BACK TABLE 24X17X13 BIG (DRAPES) ×2 IMPLANT
DEVICE INTERBODY ELEVATE 10X23 (Cage) ×4 IMPLANT
DRAPE C-ARM 42X72 X-RAY (DRAPES) ×6 IMPLANT
DRAPE INCISE 23X17 IOBAN STRL (DRAPES) ×2
DRAPE INCISE 23X17 STRL (DRAPES) IMPLANT
DRAPE INCISE IOBAN 23X17 STRL (DRAPES) ×1 IMPLANT
DRAPE LAPAROTOMY 100X72X124 (DRAPES) ×3 IMPLANT
DRAPE POUCH INSTRU U-SHP 10X18 (DRAPES) ×3 IMPLANT
DRAPE PROXIMA HALF (DRAPES) ×3 IMPLANT
DRAPE SURG 17X23 STRL (DRAPES) ×12 IMPLANT
ELECT BLADE 4.0 EZ CLEAN MEGAD (MISCELLANEOUS) ×3
ELECT REM PT RETURN 9FT ADLT (ELECTROSURGICAL) ×3
ELECTRODE BLDE 4.0 EZ CLN MEGD (MISCELLANEOUS) ×1 IMPLANT
ELECTRODE REM PT RTRN 9FT ADLT (ELECTROSURGICAL) ×1 IMPLANT
EVACUATOR 1/8 PVC DRAIN (DRAIN) ×2 IMPLANT
GAUZE SPONGE 4X4 12PLY STRL (GAUZE/BANDAGES/DRESSINGS) ×3 IMPLANT
GAUZE SPONGE 4X4 16PLY XRAY LF (GAUZE/BANDAGES/DRESSINGS) ×3 IMPLANT
GLOVE BIO SURGEON STRL SZ8 (GLOVE) ×6 IMPLANT
GLOVE BIO SURGEON STRL SZ8.5 (GLOVE) ×6 IMPLANT
GLOVE EXAM NITRILE LRG STRL (GLOVE) IMPLANT
GLOVE EXAM NITRILE MD LF STRL (GLOVE) IMPLANT
GLOVE EXAM NITRILE XL STR (GLOVE) IMPLANT
GLOVE EXAM NITRILE XS STR PU (GLOVE) IMPLANT
GOWN STRL REUS W/ TWL LRG LVL3 (GOWN DISPOSABLE) IMPLANT
GOWN STRL REUS W/ TWL XL LVL3 (GOWN DISPOSABLE) ×2 IMPLANT
GOWN STRL REUS W/TWL 2XL LVL3 (GOWN DISPOSABLE) IMPLANT
GOWN STRL REUS W/TWL LRG LVL3 (GOWN DISPOSABLE)
GOWN STRL REUS W/TWL XL LVL3 (GOWN DISPOSABLE) ×6
KIT BASIN OR (CUSTOM PROCEDURE TRAY) ×3 IMPLANT
KIT ROOM TURNOVER OR (KITS) ×3 IMPLANT
NDL HYPO 21X1.5 SAFETY (NEEDLE) IMPLANT
NEEDLE HYPO 21X1.5 SAFETY (NEEDLE) IMPLANT
NEEDLE HYPO 22GX1.5 SAFETY (NEEDLE) ×3 IMPLANT
NS IRRIG 1000ML POUR BTL (IV SOLUTION) ×3 IMPLANT
PACK LAMINECTOMY NEURO (CUSTOM PROCEDURE TRAY) ×3 IMPLANT
PAD ARMBOARD 7.5X6 YLW CONV (MISCELLANEOUS) ×9 IMPLANT
PATTIES SURGICAL .5 X1 (DISPOSABLE) IMPLANT
PUTTY KINEX BIACTIVE P 10CC (Putty) ×2 IMPLANT
ROD PREBENT 6.35X70 (Rod) ×4 IMPLANT
SCREW PEDICLE VA L635 6.5X50M (Screw) ×2 IMPLANT
SCREW PEDICLE VA L635 7.5X50M (Screw) ×2 IMPLANT
SCREW SET BREAK OFF (Screw) ×12 IMPLANT
SPONGE LAP 4X18 X RAY DECT (DISPOSABLE) IMPLANT
SPONGE NEURO XRAY DETECT 1X3 (DISPOSABLE) IMPLANT
SPONGE SURGIFOAM ABS GEL 100 (HEMOSTASIS) ×3 IMPLANT
STRIP CLOSURE SKIN 1/2X4 (GAUZE/BANDAGES/DRESSINGS) ×2 IMPLANT
SUT VIC AB 1 CT1 18XBRD ANBCTR (SUTURE) ×2 IMPLANT
SUT VIC AB 1 CT1 8-18 (SUTURE) ×6
SUT VIC AB 2-0 CP2 18 (SUTURE) ×6 IMPLANT
SYR 20CC LL (SYRINGE) ×2 IMPLANT
TAPE CLOTH SURG 4X10 WHT LF (GAUZE/BANDAGES/DRESSINGS) ×2 IMPLANT
TOWEL OR 17X24 6PK STRL BLUE (TOWEL DISPOSABLE) ×3 IMPLANT
TOWEL OR 17X26 10 PK STRL BLUE (TOWEL DISPOSABLE) ×3 IMPLANT
TRAY FOLEY W/METER SILVER 14FR (SET/KITS/TRAYS/PACK) ×3 IMPLANT
WATER STERILE IRR 1000ML POUR (IV SOLUTION) ×3 IMPLANT

## 2015-06-11 NOTE — Anesthesia Preprocedure Evaluation (Signed)
Anesthesia Evaluation  Patient identified by MRN, date of birth, ID band Patient awake    Reviewed: Allergy & Precautions, NPO status , Patient's Chart, lab work & pertinent test results  History of Anesthesia Complications (+) PONV  Airway Mallampati: I  TM Distance: >3 FB Neck ROM: Full    Dental   Pulmonary    Pulmonary exam normal        Cardiovascular hypertension, Pt. on medications Normal cardiovascular exam     Neuro/Psych    GI/Hepatic   Endo/Other    Renal/GU      Musculoskeletal   Abdominal   Peds  Hematology   Anesthesia Other Findings   Reproductive/Obstetrics                             Anesthesia Physical Anesthesia Plan  ASA: II  Anesthesia Plan: General   Post-op Pain Management:    Induction: Intravenous  Airway Management Planned: Oral ETT  Additional Equipment:   Intra-op Plan:   Post-operative Plan: Extubation in OR  Informed Consent: I have reviewed the patients History and Physical, chart, labs and discussed the procedure including the risks, benefits and alternatives for the proposed anesthesia with the patient or authorized representative who has indicated his/her understanding and acceptance.     Plan Discussed with: CRNA and Surgeon  Anesthesia Plan Comments:         Anesthesia Quick Evaluation

## 2015-06-11 NOTE — Progress Notes (Signed)
Subjective:  The patient is somnolent but arousable. She is in no apparent distress.  Objective: Vital signs in last 24 hours: Temp:  [97.7 F (36.5 C)] 97.7 F (36.5 C) (11/30 1425) Pulse Rate:  [77-83] 83 (11/30 1425) Resp:  [20] 20 (11/30 1425) BP: (98-106)/(48-64) 106/48 mmHg (11/30 1425) SpO2:  [98 %-100 %] 98 % (11/30 1425) Weight:  [76.658 kg (169 lb)] 76.658 kg (169 lb) (11/30 0809)  Intake/Output from previous day:   Intake/Output this shift: Total I/O In: 2000 [I.V.:2000] Out: 350 [Urine:150; Blood:200]  Physical exam patient is somnolent but arousable. She is moving her lower extremities well.  Lab Results: No results for input(s): WBC, HGB, HCT, PLT in the last 72 hours. BMET No results for input(s): NA, K, CL, CO2, GLUCOSE, BUN, CREATININE, CALCIUM in the last 72 hours.  Studies/Results: Dg Lumbar Spine 2-3 Views  06/11/2015  CLINICAL DATA:  Post L3-L4 fusion EXAM: LUMBAR SPINE - 2-3 VIEW; DG C-ARM 61-120 MIN COMPARISON:  04/17/2015 FINDINGS: Two views of the lumbar spine submitted. Again noted metallic fixation rods and transpedicular screws L4-L5 level. New transpedicular screws are noted L3 level. Postsurgical disc material noted at L3-L4 level. There is anatomic alignment. IMPRESSION: Again noted metallic fixation rods and transpedicular screws L4-L5 level. New transpedicular screws are noted L3 level. Postsurgical disc material noted at L3-L4 level. There is anatomic alignment. Fluoroscopy time 17.2 seconds.  Please see the operative report. Electronically Signed   By: Lahoma Crocker M.D.   On: 06/11/2015 13:46   Dg C-arm 1-60 Min  06/11/2015  CLINICAL DATA:  Post L3-L4 fusion EXAM: LUMBAR SPINE - 2-3 VIEW; DG C-ARM 61-120 MIN COMPARISON:  04/17/2015 FINDINGS: Two views of the lumbar spine submitted. Again noted metallic fixation rods and transpedicular screws L4-L5 level. New transpedicular screws are noted L3 level. Postsurgical disc material noted at L3-L4 level.  There is anatomic alignment. IMPRESSION: Again noted metallic fixation rods and transpedicular screws L4-L5 level. New transpedicular screws are noted L3 level. Postsurgical disc material noted at L3-L4 level. There is anatomic alignment. Fluoroscopy time 17.2 seconds.  Please see the operative report. Electronically Signed   By: Lahoma Crocker M.D.   On: 06/11/2015 13:46    Assessment/Plan: The patient is doing well.  LOS: 0 days     Dezi Schaner D 06/11/2015, 2:34 PM

## 2015-06-11 NOTE — Anesthesia Postprocedure Evaluation (Signed)
Anesthesia Post Note  Patient: [name]  Procedure(s) Performed: Procedure(s) (LRB): Lumbar three to lumbar four posterior lumbar interbody fusion with interbody prosthesis posterior lateral arthrodesis and posterior segmental instrumentation and exploration of previous fusion (N/A)  Patient location during evaluation: PACU Anesthesia Type: General Level of consciousness: awake and alert Pain management: pain level controlled Vital Signs Assessment: post-procedure vital signs reviewed and stable Respiratory status: spontaneous breathing, nonlabored ventilation, respiratory function stable and patient connected to nasal cannula oxygen Cardiovascular status: blood pressure returned to baseline and stable Postop Assessment: no signs of nausea or vomiting Anesthetic complications: no    Last Vitals:  Filed Vitals:   06/11/15 1445 06/11/15 1500  BP: 100/51 105/56  Pulse: 77 71  Temp:    Resp: 12 18    Last Pain:  Filed Vitals:   06/11/15 1511  PainSc: 8     LLE Motor Response: Purposeful movement, Responds to commands LLE Sensation: Full sensation RLE Motor Response: Purposeful movement, Responds to commands RLE Sensation: Full sensation      Therisa Mennella DAVID

## 2015-06-11 NOTE — Transfer of Care (Signed)
Immediate Anesthesia Transfer of Care Note  Patient: [name]  Procedure(s) Performed: Procedure(s) with comments: Lumbar three to lumbar four posterior lumbar interbody fusion with interbody prosthesis posterior lateral arthrodesis and posterior segmental instrumentation and exploration of previous fusion (N/A) - L34 posterior lumbar interbody fusion with interbody prosthesis posteiror lateral arthrodesis and posterior nonsegmental instrumentation with exploration fo previous fusion  Patient Location: PACU  Anesthesia Type:General  Level of Consciousness: awake and alert   Airway & Oxygen Therapy: Patient Spontanous Breathing and Patient connected to nasal cannula oxygen  Post-op Assessment: Report given to RN, Post -op Vital signs reviewed and stable and Patient moving all extremities X 4  Post vital signs: Reviewed and stable  Last Vitals:  Filed Vitals:   06/11/15 0712  BP: 98/64  Pulse: 77  Temp: 36.5 C  Resp: 20    Complications: No apparent anesthesia complications

## 2015-06-11 NOTE — H&P (Signed)
Subjective: The patient is a 69 year old white female on whom I previously performed an L4-5 decompression and fusion. She did well for years but has developed recurrent back, buttock, and leg pain consistent with a lumbar radiculopathy/neurogenic claudication. She has failed medical management and was worked up with a lumbar MRI which demonstrated spinal stenosis at L3-4. I discussed the various treatment options with the patient including surgery. She has decided proceed with an L3-4 decompression, instrument dictation, and fusion.   Past Medical History  Diagnosis Date  . Benign essential HTN 11/26/2014  . 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 DDD (degenerative disc disease), cervical 11/26/2014  . Back ache 11/26/2014  . H/O malignant neoplasm of skin 06/20/2012  . Renal colic [id number]  . 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 Bilateral tinnitus 11/26/2014  . History of kidney stones   . History of pneumonia   . Hemorrhoids   . PONV (postoperative nausea and vomiting)   . Numbness and tingling   . Gout   . DVT (deep venous thrombosis) ([place])   . Cancer ([place])     basal cell carcinoma on nose  . History of anemia     Past Surgical History  Procedure Laterality Date  . Lower back surgery    . Cholecystectomy    . Tubal ligation    . Knee surgery Left   . Neck surgery      x4  . Tonsillectomy    . Wisdom tooth extraction    . Eye surgery Left   . Retinal detachment surgery Left   . Cystoscopy/retrograde/ureteroscopy    . Skin cancer excision      nose    Allergies  Allergen Reactions  . Iodinated Diagnostic Agents Anaphylaxis  . Clarithromycin Other (See Comments)    Generalized weakness Heart feels funny  . Codeine Nausea Only    Social History  Substance Use Topics  . Smoking status: Never Smoker   . Smokeless tobacco: Not on file  . Alcohol Use: No    History reviewed. No pertinent family history. Prior to Admission medications   Medication Sig Start Date  End Date Taking? Authorizing Provider  amLODipine-benazepril (LOTREL) 5-10 MG per capsule Take by mouth.   Yes Historical Provider, MD  Biotin 1 MG CAPS Take 1 mg by mouth daily.   Yes Historical Provider, MD  Cholecalciferol (VITAMIN D-1000 MAX ST) 1000 UNITS tablet Take 2,000 Units by mouth daily.    Yes Historical Provider, MD  colchicine 0.6 MG tablet Take 0.6 mg by mouth daily as needed (gout).   Yes Historical Provider, MD  FIBER PO Take 1 tablet by mouth daily.   Yes Historical Provider, MD  Flaxseed, Linseed, (FLAXSEED OIL) 1000 MG CAPS Take 1,000 mg by mouth daily.    Yes Historical Provider, MD  gabapentin (NEURONTIN) 400 MG capsule Take 400-800 mg by mouth 3 (three) times daily. 400 in the morning, 400 in the afternoon and 800mg  at bedtime 02/26/15  Yes Historical Provider, MD  Garlic [id number] MG CAPS Take 1,000 mg by mouth daily.    Yes Historical Provider, MD  hydrochlorothiazide (HYDRODIURIL) 25 MG tablet Take 12.5 mg by mouth daily.    Yes Historical Provider, MD  ibuprofen (ADVIL,MOTRIN) 200 MG tablet Take 800 mg by mouth every 6 (six) hours as needed (pain).   Yes Historical Provider, MD  Multiple Vitamin (MULTI-VITAMINS) TABS Take 1 tablet by mouth daily.    Yes Historical Provider, MD  Omega-3 1000 MG CAPS Take 1,000 mg  by mouth daily.    Yes Historical Provider, MD  Polyethyl Glycol-Propyl Glycol (SYSTANE ULTRA OP) Apply 1 drop to eye as needed (dry eyes).   Yes Historical Provider, MD  triamterene-hydrochlorothiazide (MAXZIDE-25) 37.5-25 MG per tablet Take 1 tablet by mouth daily.  03/24/15  Yes Historical Provider, MD     Review of Systems  Positive ROS: As aboveAll other systems have been reviewed and were otherwise negative with the exception of those mentioned in the HPI and as above.  Objective: Vital signs in last 24 hours: Temp:  [97.7 F (36.5 C)] 97.7 F (36.5 C) (11/30 0712) Pulse Rate:  [77] 77 (11/30 0712) Resp:  [20] 20 (11/30 0712) BP: (98)/(64) 98/64 mmHg  (11/30 0712) SpO2:  [100 %] 100 % (11/30 0712) Weight:  [76.658 kg (169 lb)] 76.658 kg (169 lb) (11/30 0809)  General Appearance: Alert, cooperative, no distress, Head: Normocephalic, without obvious abnormality, atraumatic Eyes: PERRL, conjunctiva/corneas clear, EOM's intact,    Ears: Normal  Throat: Normal  Neck: Supple, symmetrical, trachea midline, no adenopathy; thyroid: No enlargement/tenderness/nodules; no carotid bruit or JVD. The patient's cervical incision is well-healed  Back: Symmetric, no curvature, ROM normal, no CVA tenderness. The patient's lumbar incision is well-healed  Lungs: Clear to auscultation bilaterally, respirations unlabored Heart: Regular rate and rhythm, no murmur, rub or gallop Abdomen: Soft, non-tender,, no masses, no organomegaly Extremities: Extremities normal, atraumatic, no cyanosis or edema Pulses: 2+ and symmetric all extremities Skin: Skin color, texture, turgor normal, no rashes or lesions  NEUROLOGIC:   Mental status: alert and oriented, no aphasia, good attention span, Fund of knowledge/ memory ok Motor Exam - grossly normal Sensory Exam - grossly normal Reflexes:  Coordination - grossly normal Gait - grossly normal Balance - grossly normal Cranial Nerves: I: smell Not tested  II: visual acuity  OS: Normal  OD: Normal   II: visual fields Full to confrontation  II: pupils Equal, round, reactive to light  III,VII: ptosis None  III,IV,VI: extraocular muscles  Full ROM  V: mastication Normal  V: facial light touch sensation  Normal  V,VII: corneal reflex  Present  VII: facial muscle function - upper  Normal  VII: facial muscle function - lower Normal  VIII: hearing Not tested  IX: soft palate elevation  Normal  IX,X: gag reflex Present  XI: trapezius strength  5/5  XI: sternocleidomastoid strength 5/5  XI: neck flexion strength  5/5  XII: tongue strength  Normal    Data Review Lab Results  Component Value Date   WBC 7.9  06/03/2015   HGB 13.8 06/03/2015   HCT 43.2 06/03/2015   MCV 95.2 06/03/2015   PLT 316 06/03/2015   Lab Results  Component Value Date   NA 134* 03/03/2010   K 3.7 03/03/2010   CL 99 03/03/2010   CO2 28 03/03/2010   BUN 10 03/03/2010   CREATININE 0.76 03/03/2010   GLUCOSE 117* 03/03/2010   No results found for: INR, PROTIME  Assessment/Plan:  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 We have discussed the various treatment options including surgery. I have described the surgical treatment option of an L3-4 decompression, instrument taking, and fusion. I have shown her surgical models. We have discussed the risks, benefits, alternatives, and likelihood of achieving goals with surgery. I have answered all the patient's questions. She has decided to proceed with surgery.   Tyrone Pautsch D 06/11/2015 10:18 AM

## 2015-06-11 NOTE — Op Note (Signed)
Brief history: The patient is a 69 year old white female on whom I previously performed a L4-5 decompression, instrumentation, and fusion. She has done well but recently has developed recurrent back, buttock and leg pain consistent with neurogenic claudication. She has failed medical management and was worked up with a lumbar MRI. 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 I discussed the various treatment options with the patient including surgery. She has weighed the risks, benefits, and alternative surgery and decided proceed with an L3-4 decompression, instrumentation, and fusion with an exploration of her old fusion and removal of old hardware.  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 Etc. requiring a wide decompression of the nerve roots.); L3-4 posterior lumbar interbody fusion with local morselized autograft bone and Kinnex graft extender; insertion of interbody prosthesis at L3-4 (Medtronic peek interbody expandable prosthesis); posterior segmental instrumentation from L3 to L5 with Medtronic Legacy pedicle screws and rods; posterior lateral arthrodesis at L3-4 with local morselized autograft bone and Kinnex bone graft extender; exploration of lumbar fusion/removal of lumbar hardware  Surgeon: Dr. Earle Gell  Asst.: Dr. Dayton Bailiff  Anesthesia: Gen. endotracheal  Estimated blood loss: 200 mL  Drains: One medium Hemovac  Complications: None  Description of procedure: The patient was brought to  the operating room by the anesthesia team. General endotracheal anesthesia was induced. The patient was turned to the prone position on the Wilson frame. The patient's lumbosacral region was then prepared with Betadine scrub and Betadine solution. Sterile drapes were applied.  I then injected the area to be incised with Marcaine with epinephrine solution. I then used the scalpel to make a linear midline incision over the L3-4 and L4-5 interspace. I then used electrocautery to perform a bilateral subperiosteal dissection exposing the spinous process and lamina of L3, L4 and L5, as well as exposing the old hardware at L4-5. I explored the fusion by removing the caps from the old pedicle screws and removing the old rods. We attempted to independently move the screws. The arthrodesis appears solid at L4-5. We then inserted the Verstrac retractor to provide exposure.  I began the decompression by using the high speed drill to perform laminotomies at L3-4 bilaterally. We then used the Kerrison punches to widen the laminotomy and removed the ligamentum flavum at L3-4 bilaterally. We used the Kerrison punches to remove the medial facets at L3-4 bilaterally. We freed up the synovial cyst from the dura and removed it with a Kerrison punches decompressing the thecal sac. We performed wide foraminotomies about the bilateral L3 and L4 nerve roots completing the decompression.  We now turned our attention to the posterior lumbar interbody fusion. I used a scalpel to incise the intervertebral disc at L3-4 bilaterally. I then performed a partial intervertebral discectomy at L3-4 bilaterally using the pituitary forceps. We prepared the vertebral endplates at [id number] bilaterally for the fusion by removing the soft tissues with the curettes. We then used the trial spacers to pick the appropriate sized interbody prosthesis. We prefilled his prosthesis with a combination of local morselized autograft bone that we obtained during the  decompression as well as Kinnex bone graft extender. We inserted the prefilled prosthesis into the interspace at L3-4 bilaterally, we then expanded the prosthesis. There was a good snug fit of the prosthesis in the interspace. We then filled and the remainder of the intervertebral disc space with local morselized autograft bone and Kinnex. This completed the posterior lumbar interbody arthrodesis.  We now turned attention to the instrumentation. Under fluoroscopic guidance we cannulated the bilateral L3 pedicles with the bone probe. We then removed the bone probe. We then tapped the pedicle with a 5.5 and 6.5 millimeter tap. We then removed the tap. We probed inside the tapped pedicle with a ball probe to rule out cortical breaches. We then inserted a 6.5 and 7.5 x 50 millimeter pedicle screw into the L3 pedicles bilaterally under fluoroscopic guidance. We then palpated along the medial aspect of the pedicles to rule out cortical breaches. There were none. The nerve roots were not injured. We then connected the unilateral pedicle screws from L3-L5 with a lordotic rod. We compressed the construct and secured the rod in place with the caps. We then tightened the caps appropriately. This completed the instrumentation from L3-L5.  We now turned our attention to the posterior lateral arthrodesis at L3-4. We used the high-speed drill to decorticate the remainder of the facets, pars, transverse process at L3-4. We then applied a combination of local morselized autograft bone and Kinnex bone graft extender over these decorticated posterior lateral structures. This completed the posterior lateral arthrodesis.  We then obtained hemostasis using bipolar electrocautery. We irrigated the wound out with bacitracin solution. We inspected the thecal sac and nerve roots and noted they were well decompressed. We then removed the retractor. We placed vancomycin powder in the wound. We placed a medium Hemovac drain in the  epidural space and tunneled out through separate stab wound. We reapproximated patient's thoracolumbar fascia with interrupted #1 Vicryl suture. We reapproximated patient's subcutaneous tissue with interrupted 2-0 Vicryl suture. The reapproximated patient's skin with Steri-Strips and benzoin. The wound was then coated with bacitracin ointment. A sterile dressing was applied. The drapes were removed. The patient was subsequently returned to the supine position where they were extubated by the anesthesia team. He was then transported to the post anesthesia care unit in stable condition. All sponge instrument and needle counts were reportedly correct at the end of this case.

## 2015-06-11 NOTE — Anesthesia Procedure Notes (Signed)
Procedure Name: Intubation Date/Time: 06/11/2015 10:54 AM Performed by: [place] Pink Pre-anesthesia Checklist: Patient identified, Patient being monitored, Emergency Drugs available, Timeout performed and Suction available Patient Re-evaluated:Patient Re-evaluated prior to inductionOxygen Delivery Method: Circle system utilized Preoxygenation: Pre-oxygenation with 100% oxygen Intubation Type: IV induction Ventilation: Mask ventilation without difficulty Laryngoscope Size: Mac and 3 Grade View: Grade II Tube type: Oral Tube size: 7.0 mm Number of attempts: 1 Airway Equipment and Method: Stylet Placement Confirmation: ETT inserted through vocal cords under direct vision,  breath sounds checked- equal and bilateral and positive ETCO2 Secured at: 21 cm Tube secured with: Tape Dental Injury: Teeth and Oropharynx as per pre-operative assessment

## 2015-06-12 LAB — CBC
HCT: 32.9 % — ABNORMAL LOW (ref 36.0–46.0)
HEMOGLOBIN: 10.5 g/dL — AB (ref 12.0–15.0)
MCH: 30.5 pg (ref 26.0–34.0)
MCHC: 31.9 g/dL (ref 30.0–36.0)
MCV: 95.6 fL (ref 78.0–100.0)
PLATELETS: 260 10*3/uL (ref 150–400)
RBC: 3.44 MIL/uL — AB (ref 3.87–5.11)
RDW: 13.1 % (ref 11.5–15.5)
WBC: 10.8 10*3/uL — ABNORMAL HIGH (ref 4.0–10.5)

## 2015-06-12 LAB — BASIC METABOLIC PANEL
Anion gap: 7 (ref 5–15)
BUN: 8 mg/dL (ref 6–20)
CO2: 30 mmol/L (ref 22–32)
Calcium: 8.3 mg/dL — ABNORMAL LOW (ref 8.9–10.3)
Chloride: 102 mmol/L (ref 101–111)
Creatinine, Ser: 0.7 mg/dL (ref 0.44–1.00)
GFR calc Af Amer: >60 mL/min (ref >60)
GLUCOSE: 113 mg/dL — AB (ref 65–99)
POTASSIUM: 3.8 mmol/L (ref 3.5–5.1)
Sodium: 139 mmol/L (ref 135–145)

## 2015-06-12 NOTE — Progress Notes (Signed)
Utilization review completed.  

## 2015-06-12 NOTE — Evaluation (Signed)
Physical Therapy Evaluation Patient Details Name: [name] MRN: [id number] DOB: 09-08-1945 Today's Date: 06/12/2015   History of Present Illness  Pt is a 69 y/o female who presents s/p L3-L4 PLIF on 06/10/25.  Clinical Impression  Pt admitted with above diagnosis. Pt currently with functional limitations due to the deficits listed below (see PT Problem List). At the time of PT eval pt was able to perform transfers and ambulation with min guard to supervision for safety. Pt was instructed in brace application and back precautions. Pt will benefit from skilled PT to increase their independence and safety with mobility to allow discharge to the venue listed below.       Follow Up Recommendations Outpatient PT    Equipment Recommendations  None recommended by PT    Recommendations for Other Services       Precautions / Restrictions Precautions Precautions: Fall;Back Precaution Booklet Issued: Yes (comment) Precaution Comments: Pt was instructed in 3/3 back precautions.  Required Braces or Orthoses: Spinal Brace Spinal Brace: Lumbar corset;Applied in sitting position Restrictions Weight Bearing Restrictions: No      Mobility  Bed Mobility Overal bed mobility: Needs Assistance Bed Mobility: Rolling;Sidelying to Sit Rolling: Min guard Sidelying to sit: Min guard       General bed mobility comments: VC's for proper log roll technique.   Transfers Overall transfer level: Needs assistance Equipment used: None Transfers: Sit to/from Stand Sit to Stand: Supervision         General transfer comment: Supervision for safety. No assist required.   Ambulation/Gait Ambulation/Gait assistance: Supervision Ambulation Distance (Feet): 400 Feet Assistive device: None Gait Pattern/deviations: Step-through pattern;Decreased stride length Gait velocity: Decreased Gait velocity interpretation: Below normal speed for age/gender General Gait Details: Pt was able to ambulate in  hall with UE support initially on railings, but was able to progress to unsupported ambulation. No AD required and no assist needed.   Stairs Stairs: Yes Stairs assistance: Supervision Stair Management: One rail Left;Alternating pattern;Step to pattern;Forwards Number of Stairs: 10 General stair comments: VC's for sequencing and safety awareness.   Wheelchair Mobility    Modified Rankin (Stroke Patients Only)       Balance Overall balance assessment: Needs assistance Sitting-balance support: Feet supported;No upper extremity supported Sitting balance-Leahy Scale: Fair     Standing balance support: No upper extremity supported;During functional activity Standing balance-Leahy Scale: Fair                               Pertinent Vitals/Pain Pain Assessment: Faces Faces Pain Scale: Hurts little more Pain Location: Back Pain Descriptors / Indicators: Operative site guarding;Discomfort Pain Intervention(s): Limited activity within patient's tolerance;Monitored during session;Repositioned    Home Living Family/patient expects to be discharged to:: Private residence Living Arrangements: Spouse/significant other Available Help at Discharge: Family;Available 24 hours/day Type of Home: House Home Access: Stairs to enter Entrance Stairs-Rails: Right Entrance Stairs-Number of Steps: 4 Home Layout: One level Home Equipment: Shower seat      Prior Function Level of Independence: Independent               Hand Dominance   Dominant Hand: Right    Extremity/Trunk Assessment   Upper Extremity Assessment: Defer to OT evaluation           Lower Extremity Assessment: RLE deficits/detail RLE Deficits / Details: Decreased strength and muscular endurance consistent with pre-op symptoms    Cervical / Trunk Assessment: Normal  Communication   Communication: No difficulties  Cognition Arousal/Alertness: Awake/alert Behavior During Therapy: WFL for tasks  assessed/performed Overall Cognitive Status: Within Functional Limits for tasks assessed                      General Comments      Exercises        Assessment/Plan    PT Assessment Patient needs continued PT services  PT Diagnosis Difficulty walking;Acute pain   PT Problem List Decreased strength;Decreased range of motion;Decreased activity tolerance;Decreased balance;Decreased mobility;Decreased knowledge of use of DME;Decreased knowledge of precautions;Decreased safety awareness;Pain  PT Treatment Interventions DME instruction;Gait training;Stair training;Functional mobility training;Therapeutic exercise;Therapeutic activities;Neuromuscular re-education;Patient/family education   PT Goals (Current goals can be found in the Care Plan section) Acute Rehab PT Goals Patient Stated Goal: Home at d/c PT Goal Formulation: With patient Time For Goal Achievement: 06/19/15 Potential to Achieve Goals: Good    Frequency Min 5X/week   Barriers to discharge        Co-evaluation               End of Session Equipment Utilized During Treatment: Back brace Activity Tolerance: Patient tolerated treatment well Patient left: in chair;with call bell/phone within reach Nurse Communication: Mobility status         Time: [id number] PT Time Calculation (min) (ACUTE ONLY): 30 min   Charges:   PT Evaluation $Initial PT Evaluation Tier I: 1 Procedure PT Treatments $Gait Training: 8-22 mins   PT G Codes:        Rolinda Roan 2015/06/29, 9:59 AM   Rolinda Roan, PT, DPT Acute Rehabilitation Services Pager: [phone]

## 2015-06-12 NOTE — Progress Notes (Signed)
Patient ID: [name], female   DOB: 22-Jan-1946, 69 y.o.   MRN: [id number] Subjective:  the patient is alert and pleasant. She is in no apparent distress. Her back is appropriately sore. She wants to go home tomorrow.  Objective: Vital signs in last 24 hours: Temp:  [97.4 F (36.3 C)-98.4 F (36.9 C)] 97.4 F (36.3 C) (12/01 1611) Pulse Rate:  [72-84] 84 (12/01 1611) Resp:  [16-20] 20 (12/01 1611) BP: (95-113)/(33-68) 102/63 mmHg (12/01 1611) SpO2:  [96 %-100 %] 100 % (12/01 1611)  Intake/Output from previous day: 11/30 0701 - 12/01 0700 In: 2000 [I.V.:2000] Out: 1430 [Urine:950; Drains:280; Blood:200] Intake/Output this shift: Total I/O In: 360 [P.O.:360] Out: 80 [Drains:80]  Physical exam the patient is alert and pleasant. Her strength is grossly normal in her lower extremities.  Lab Results:  Recent Labs  06/12/15 0341  WBC 10.8*  HGB 10.5*  HCT 32.9*  PLT 260   BMET  Recent Labs  06/12/15 0341  NA 139  K 3.8  CL 102  CO2 30  GLUCOSE 113*  BUN 8  CREATININE 0.70  CALCIUM 8.3*    Studies/Results: Dg Lumbar Spine 2-3 Views  06/11/2015  CLINICAL DATA:  Post L3-L4 fusion EXAM: LUMBAR SPINE - 2-3 VIEW; DG C-ARM 61-120 MIN COMPARISON:  04/17/2015 FINDINGS: Two views of the lumbar spine submitted. Again noted metallic fixation rods and transpedicular screws L4-L5 level. New transpedicular screws are noted L3 level. Postsurgical disc material noted at L3-L4 level. There is anatomic alignment. IMPRESSION: Again noted metallic fixation rods and transpedicular screws L4-L5 level. New transpedicular screws are noted L3 level. Postsurgical disc material noted at L3-L4 level. There is anatomic alignment. Fluoroscopy time 17.2 seconds.  Please see the operative report. Electronically Signed   By: Lahoma Crocker M.D.   On: 06/11/2015 13:46   Dg C-arm 1-60 Min  06/11/2015  CLINICAL DATA:  Post L3-L4 fusion EXAM: LUMBAR SPINE - 2-3 VIEW; DG C-ARM 61-120 MIN COMPARISON:   04/17/2015 FINDINGS: Two views of the lumbar spine submitted. Again noted metallic fixation rods and transpedicular screws L4-L5 level. New transpedicular screws are noted L3 level. Postsurgical disc material noted at L3-L4 level. There is anatomic alignment. IMPRESSION: Again noted metallic fixation rods and transpedicular screws L4-L5 level. New transpedicular screws are noted L3 level. Postsurgical disc material noted at L3-L4 level. There is anatomic alignment. Fluoroscopy time 17.2 seconds.  Please see the operative report. Electronically Signed   By: Lahoma Crocker M.D.   On: 06/11/2015 13:46    Assessment/Plan: Postop day #1: The patient is doing well. We will discharge her Hemovac drain and plan to send her home tomorrow.   LOS: 1 day     Yancy Hascall D 06/12/2015, 5:43 PM

## 2015-06-13 MED ORDER — OXYCODONE-ACETAMINOPHEN 10-325 MG PO TABS: 1.0000 | ORAL_TABLET | ORAL | Status: DC | PRN

## 2015-06-13 MED ORDER — CYCLOBENZAPRINE HCL 10 MG PO TABS
10.0000 mg | ORAL_TABLET | Freq: Three times a day (TID) | ORAL | Status: DC | PRN
Start: 2015-06-13 — End: 2023-11-22

## 2015-06-13 MED ORDER — DOCUSATE SODIUM 100 MG PO CAPS: 100.0000 mg | ORAL_CAPSULE | Freq: Two times a day (BID) | ORAL | Status: DC

## 2015-06-13 NOTE — Progress Notes (Signed)
Pt. discharged home accompanied by family. Prescriptions and discharge instructions given with verbalization of understanding. Incision site on back with no s/s of infection - no swelling, redness, bleeding, and/or drainage noted. Lumbar brace in place. Pain med given just before leaving. Opportunity given to ask questions but no question asked. Pt. Ambulated out of the unit.

## 2015-06-13 NOTE — Discharge Summary (Signed)
Physician Discharge Summary  Patient ID: [name] MRN: [id number] DOB/AGE: 01/13/1946 69 y.o.  Admit date: 06/11/2015 Discharge date: 06/13/2015  Admission Diagnoses: L3-4 spinal stenosis, synovial cyst, lumbar radiculopathy, lumbago, neurogenic claudication  Discharge Diagnoses: The same Active Problems:   Lumbar stenosis with neurogenic claudication   Discharged Condition: good  Hospital Course: I performed an L3-4 decompression, instrumentation, and fusion on the patient on 06/11/2015. The surgery went well.  The patient's postoperative course was unremarkable. On postoperative day #2 the patient requested discharge home. The patient was given written and oral discharge instructions. All her questions were answered.  Consults: Physical therapy Significant Diagnostic Studies: None Treatments: L3-4 decompression, agitation, and fusion, removal of old hardware Discharge Exam: Blood pressure 98/48, pulse 79, temperature 99 F (37.2 C), temperature source Oral, resp. rate 18, height 5\' 6"  (1.676 m), weight 76.658 kg (169 lb), SpO2 100 %. The patient is alert and pleasant. She looks well. Her dressing is clean and dry. Her strength is grossly normal in her lower extremities.  Disposition: Home  Discharge Instructions    Call MD for:  difficulty breathing, headache or visual disturbances    Complete by:  As directed      Call MD for:  extreme fatigue    Complete by:  As directed      Call MD for:  hives    Complete by:  As directed      Call MD for:  persistant dizziness or light-headedness    Complete by:  As directed      Call MD for:  persistant nausea and vomiting    Complete by:  As directed      Call MD for:  redness, tenderness, or signs of infection (pain, swelling, redness, odor or green/yellow discharge around incision site)    Complete by:  As directed      Call MD for:  severe uncontrolled pain    Complete by:  As directed      Call MD for:  temperature  >100.4    Complete by:  As directed      Diet - low sodium heart healthy    Complete by:  As directed      Discharge instructions    Complete by:  As directed   Call [phone] for a followup appointment. Take a stool softener while you are using pain medications.     Driving Restrictions    Complete by:  As directed   Do not drive for 2 weeks.     Increase activity slowly    Complete by:  As directed      Lifting restrictions    Complete by:  As directed   Do not lift more than 5 pounds. No excessive bending or twisting.     May shower / Bathe    Complete by:  As directed   He may shower after the pain she is removed 3 days after surgery. Leave the incision alone.     Remove dressing in 24 hours    Complete by:  As directed             Medication List    STOP taking these medications        ibuprofen 200 MG tablet  Commonly known as:  ADVIL,MOTRIN      TAKE these medications        amLODipine-benazepril 5-10 MG capsule  Commonly known as:  LOTREL  Take by mouth.     Biotin 1 MG  Caps  Take 1 mg by mouth daily.     colchicine 0.6 MG tablet  Take 0.6 mg by mouth daily as needed (gout).     cyclobenzaprine 10 MG tablet  Commonly known as:  FLEXERIL  Take 1 tablet (10 mg total) by mouth 3 (three) times daily as needed for muscle spasms.     docusate sodium 100 MG capsule  Commonly known as:  COLACE  Take 1 capsule (100 mg total) by mouth 2 (two) times daily.     FIBER PO  Take 1 tablet by mouth daily.     Flaxseed Oil 1000 MG Caps  Take 1,000 mg by mouth daily.     gabapentin 400 MG capsule  Commonly known as:  NEURONTIN  Take 400-800 mg by mouth 3 (three) times daily. 400 in the morning, 400 in the afternoon and 800mg  at bedtime     Garlic 123XX123 MG Caps  Take 1,000 mg by mouth daily.     hydrochlorothiazide 25 MG tablet  Commonly known as:  HYDRODIURIL  Take 12.5 mg by mouth daily.     MULTI-VITAMINS Tabs  Take 1 tablet by mouth daily.     Omega-3  1000 MG Caps  Take 1,000 mg by mouth daily.     oxyCODONE-acetaminophen 10-325 MG tablet  Commonly known as:  PERCOCET  Take 1 tablet by mouth every 4 (four) hours as needed for pain.     SYSTANE ULTRA OP  Apply 1 drop to eye as needed (dry eyes).     triamterene-hydrochlorothiazide 37.5-25 MG tablet  Commonly known as:  MAXZIDE-25  Take 1 tablet by mouth daily.     VITAMIN D-1000 MAX ST 1000 UNITS tablet  Generic drug:  Cholecalciferol  Take 2,000 Units by mouth daily.         SignedNewman Pies D 06/13/2015, 7:50 AM

## 2015-06-13 NOTE — Progress Notes (Signed)
Physical Therapy Treatment Patient Details Name: [name] MRN: [id number] DOB: [birth year] Today's Date: 06/13/2015    History of Present Illness Pt is a 69 y/o female who presents s/p L3-L4 PLIF on 06/10/25.    PT Comments    Pt progressing towards physical therapy goals. Appeared more unsteady this session and pt reports this is due to increased pain medication. Pt continues to require cueing to maintain back precautions, especially with bed mobility/log roll. Pt anticipates d/c home today. Will continue to follow.   Follow Up Recommendations  Outpatient PT     Equipment Recommendations  None recommended by PT    Recommendations for Other Services       Precautions / Restrictions Precautions Precautions: Fall;Back Precaution Comments: Reviewed 3/3 back precautions. Pt was not able to recall without cues and required VC's during functional mobility to maintain precautions.  Required Braces or Orthoses: Spinal Brace Spinal Brace: Lumbar corset;Applied in sitting position Restrictions Weight Bearing Restrictions: No    Mobility  Bed Mobility Overal bed mobility: Needs Assistance Bed Mobility: Supine to Sit     Supine to sit: Supervision     General bed mobility comments: VC's for proper log roll technique. Pt continues to try to transition to sitting without roll. States she knows how to do the log roll but will not demonstrate.   Transfers Overall transfer level: Needs assistance Equipment used: None Transfers: Sit to/from Stand Sit to Stand: Supervision         General transfer comment: Supervision for safety. Pt appears unsteady but did not require assistance.   Ambulation/Gait Ambulation/Gait assistance: Supervision;Min guard Ambulation Distance (Feet): 400 Feet Assistive device: None Gait Pattern/deviations: Step-through pattern;Decreased stride length;Trunk flexed Gait velocity: Decreased Gait velocity interpretation: Below normal speed for  age/gender General Gait Details: Pt ambulated with occasional support on the railings in the hall. Pt occasionally unsteady and required infrequent min guard assist for safety. Pt reports she feels "woozy" due to pain medications.    Stairs Stairs: Yes Stairs assistance: Min guard Stair Management: One rail Right;Forwards Number of Stairs: 10 General stair comments: VC's for general safety. Pt was provided min guard due to unsteadiness noted earlier in session.   Wheelchair Mobility    Modified Rankin (Stroke Patients Only)       Balance Overall balance assessment: Needs assistance Sitting-balance support: Feet supported;No upper extremity supported Sitting balance-Leahy Scale: Fair     Standing balance support: No upper extremity supported;During functional activity Standing balance-Leahy Scale: Fair Standing balance comment: Occasional unsteadiness with dynamic activity                    Cognition Arousal/Alertness: Awake/alert Behavior During Therapy: WFL for tasks assessed/performed Overall Cognitive Status: Within Functional Limits for tasks assessed                      Exercises      General Comments        Pertinent Vitals/Pain Pain Assessment: Faces Faces Pain Scale: Hurts little more Pain Location: Back Pain Descriptors / Indicators: Operative site guarding;Discomfort Pain Intervention(s): Limited activity within patient's tolerance;Monitored during session;Repositioned    Home Living                      Prior Function            PT Goals (current goals can now be found in the care plan section) Acute Rehab PT Goals Patient Stated Goal:  Discharge today PT Goal Formulation: With patient Time For Goal Achievement: 06/19/15 Potential to Achieve Goals: Good Progress towards PT goals: Progressing toward goals    Frequency  Min 5X/week    PT Plan Current plan remains appropriate    Co-evaluation             End  of Session Equipment Utilized During Treatment: Back brace Activity Tolerance: Patient tolerated treatment well Patient left: in chair;with call bell/phone within reach     Time: 0752-0808 PT Time Calculation (min) (ACUTE ONLY): 16 min  Charges:  $Gait Training: 8-22 mins                    G Codes:      Rolinda Roan 2015-07-08, 8:19 AM   Rolinda Roan, PT, DPT Acute Rehabilitation Services Pager: [phone]

## 2016-05-25 ENCOUNTER — Other Ambulatory Visit: Payer: Self-pay | Admitting: Internal Medicine

## 2016-05-25 DIAGNOSIS — Z1239 Encounter for other screening for malignant neoplasm of breast: Secondary | ICD-10-CM

## 2016-07-08 ENCOUNTER — Ambulatory Visit: Payer: Medicare Other

## 2016-08-09 ENCOUNTER — Ambulatory Visit
Admission: RE | Admit: 2016-08-09 | Discharge: 2016-08-09 | Disposition: A | Discharge status: Home / Self Care | Payer: Medicare Other | Source: Ambulatory Visit | Attending: Internal Medicine | Admitting: Internal Medicine

## 2016-08-09 DIAGNOSIS — Z1231 Encounter for screening mammogram for malignant neoplasm of breast: Secondary | ICD-10-CM | POA: Insufficient documentation

## 2016-08-09 DIAGNOSIS — Z1239 Encounter for other screening for malignant neoplasm of breast: Secondary | ICD-10-CM

## 2016-09-14 ENCOUNTER — Other Ambulatory Visit: Payer: Self-pay | Admitting: Neurosurgery

## 2016-09-14 DIAGNOSIS — M5416 Radiculopathy, lumbar region: Secondary | ICD-10-CM

## 2016-09-28 ENCOUNTER — Other Ambulatory Visit: Payer: Self-pay | Admitting: Neurosurgery

## 2016-09-28 ENCOUNTER — Ambulatory Visit
Admission: RE | Admit: 2016-09-28 | Discharge: 2016-09-28 | Disposition: A | Discharge status: Home / Self Care | Payer: Medicare Other | Source: Ambulatory Visit | Attending: Neurosurgery | Admitting: Neurosurgery

## 2016-09-28 DIAGNOSIS — M5416 Radiculopathy, lumbar region: Secondary | ICD-10-CM | POA: Diagnosis present

## 2016-09-28 DIAGNOSIS — M48061 Spinal stenosis, lumbar region without neurogenic claudication: Secondary | ICD-10-CM | POA: Insufficient documentation

## 2016-09-28 DIAGNOSIS — M5127 Other intervertebral disc displacement, lumbosacral region: Secondary | ICD-10-CM | POA: Insufficient documentation

## 2016-09-28 DIAGNOSIS — M5116 Intervertebral disc disorders with radiculopathy, lumbar region: Secondary | ICD-10-CM | POA: Insufficient documentation

## 2016-09-28 DIAGNOSIS — M4316 Spondylolisthesis, lumbar region: Secondary | ICD-10-CM | POA: Insufficient documentation

## 2016-09-28 DIAGNOSIS — Z981 Arthrodesis status: Secondary | ICD-10-CM | POA: Insufficient documentation

## 2017-04-06 ENCOUNTER — Ambulatory Visit: Payer: Medicare Other | Attending: Orthopedic Surgery | Admitting: Occupational Therapy

## 2017-04-06 DIAGNOSIS — M25542 Pain in joints of left hand: Secondary | ICD-10-CM | POA: Diagnosis present

## 2017-04-06 DIAGNOSIS — M25532 Pain in left wrist: Secondary | ICD-10-CM | POA: Insufficient documentation

## 2017-04-06 NOTE — Patient Instructions (Signed)
Can do contrast to wrist  [facility] Neoprene splint to use with act - if cannot wear thumb spica -and act cause pain to thumb or wrist   Joint protection principles and modifications discuss and hand out provided  AE review - options and demo some of them - info provided  Cooking hand out with modifications  Arthritis bra - fastening front - provided info  Return in week

## 2017-04-06 NOTE — Therapy (Signed)
[facility] [address] [place], [address] Phone: [phone]   Fax:  [phone]  Occupational Therapy Evaluation  Patient Details  Name: [name] MRN: [id number] Date of Birth: September 09, 1945 Referring Provider: Dr Rudene Christians  Encounter Date: 04/06/2017      OT End of Session - 04/06/17 1339    Visit Number 1   Number of Visits 3   Date for OT Re-Evaluation 05/04/17   OT Start Time 1213   OT Stop Time 1318   OT Time Calculation (min) 65 min   Activity Tolerance Patient tolerated treatment well   Behavior During Therapy [facility] for tasks assessed/performed      Past Medical History:  Diagnosis Date  . Back ache 11/26/2014  . Benign essential HTN 11/26/2014  . Bilateral tinnitus 11/26/2014  . Cancer ([place])    basal cell carcinoma on nose  . DDD (degenerative disc disease), cervical 11/26/2014  . 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 DVT (deep venous thrombosis) ([place])   . Gout   . H/O malignant neoplasm of skin 06/20/2012  . Hemorrhoids   . History of anemia   . History of kidney stones   . History of pneumonia   . Numbness and tingling   . 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 PONV (postoperative nausea and vomiting)   . Renal colic 7/82/4235    Past Surgical History:  Procedure Laterality Date  . BREAST BIOPSY Right    Patient states she had a lump removed  . CHOLECYSTECTOMY    . CYSTOSCOPY/RETROGRADE/URETEROSCOPY    . EYE SURGERY Left   . KNEE SURGERY Left   . lower back surgery    . NECK SURGERY     x4  . RETINAL DETACHMENT SURGERY Left   . SKIN CANCER EXCISION     nose  . TONSILLECTOMY    . TUBAL LIGATION    . WISDOM TOOTH EXTRACTION      There were no vitals filed for this visit.      Subjective Assessment - 04/06/17 1323    Subjective  Dr Rudene Christians send me to you - xray showed arthritis but also some tendinitis -  I did hear a pop at my L wrist when I was pushing a lot my my hands  in recliner after my back surgery    Patient Stated Goals What adaptive equipment is available or adaptations to make some of the tasks easier and less stress on my hands - to decrease the pain    Currently in Pain? 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 - do gardening and canning/ freezing, washing dogs, cook , some light house work , on [employer]  , no tablet      AROM   Right Wrist Extension 63 Degrees  Right Wrist Flexion 85 Degrees   Right Wrist Radial Deviation 30 Degrees   Right Wrist Ulnar Deviation 35 Degrees   Left Wrist Extension 55 Degrees   Left Wrist Flexion 85 Degrees   Left Wrist Radial Deviation 18 Degrees   Left Wrist Ulnar Deviation 22 Degrees     Strength   Right Hand Grip (lbs) 59   Right Hand Lateral Pinch 16 lbs   Right Hand 3 Point Pinch 14 lbs   Left Hand Grip (lbs) 30  2-3/10 pain  and with CMCsplint 40lbs   Left Hand Lateral Pinch 12 lbs  2-3/10 pain with CMCsplint 14lbs   Left Hand 3 Point Pinch 12 lbs     Left Hand AROM   L Index  MCP 0-90 75 Degrees   L Index PIP 0-100 90 Degrees   L Long  MCP 0-90 80 Degrees   L Long PIP 0-100 100 Degrees   L Ring  MCP 0-90 80 Degrees   L Ring PIP 0-100 100 Degrees   L Little  MCP 0-90 90 Degrees   L Little PIP 0-100 100 Degrees       Can do contrast to wrist at home  Pt fitted with CMC Neoprene splint to use with act - if cannot wear thumb spica -and act cause pain to thumb or wrist  Pt show increase grip from 30 to 40 lbs with CMC  neoprene splint on   Joint protection principles and modifications discuss and hand out provided  AE review - options and demo some of them - info provided  Cooking hand out with modifications  Arthritis bra - fastening front - provided info  Return in week                     OT Education - 04/06/17 1339    Education provided Yes   Education Details Findings of eval and joint protection /AE trng   Person(s) Educated Patient   Methods Explanation;Demonstration;Tactile cues;Verbal cues;Handout   Comprehension Verbal cues required;Returned demonstration;Verbalized understanding             OT Long Term Goals - 04/06/17 1343      OT LONG TERM GOAL #1   Title Pain on PRWHE improve with more than 15 points    Baseline pain on PRWHE at eval 46/50   Time 4   Period Weeks   Status New   Target Date 05/04/17     OT LONG TERM GOAL #2   Title Pt to verbalize and demo 3 AE and joint protections principles that she implemented to decrease pain    Baseline no knowledge    Time 3   Period Weeks   Status New   Target Date 04/27/17               Plan - 04/06/17 1340    Clinical Impression Statement Pt refer to OT with diagnosis of L DeQuervains but also CMC arthritis - pt report pain during day from 4/10 to 10/10 at the worse at base of thumb and wrist - ulnar and radial side - and 2nd digit - AROM in digits WFL - wrist extention , RD, UD decrease compare to R - strength  4-/5 - grip strength decrease but increase with CMC neoprene splint - prehension  strength WNL compare to the R - pt ed on modifications, joint protection and AE - to implement for week and return  for reassessment    Occupational  performance deficits (Please refer to evaluation for details): ADL's;IADL's;Play;Leisure   Rehab Potential Fair   OT Frequency 1x / week   OT Duration 4 weeks   OT Treatment/Interventions Self-care/ADL training;Contrast Bath;Splinting;Patient/family education   Plan  assess how she did with homeprogram    Clinical Decision Making Several treatment options, min-mod task modification necessary   OT Home Exercise Plan see pt instruction   Consulted and Agree with Plan of Care Patient      Patient will benefit from skilled therapeutic intervention in order to improve the following deficits and impairments:  Pain, Impaired flexibility, Decreased knowledge of use of DME  Visit Diagnosis: Pain in joint of left hand - Plan: Ot plan of care cert/re-cert  Pain in left wrist - Plan: Ot plan of care cert/re-cert      G-Codes - 20/35/59 1346    Functional Assessment Tool Used (Outpatient only) ROM , grip and prehension strength , PRWHE for pain and function - clinical judgement    Functional Limitation Self care   Self Care Current Status (R4163) At least 40 percent but less than 60 percent impaired, limited or restricted   Self Care Goal Status (A4536) At least 1 percent but less than 20 percent impaired, limited or restricted      Problem List Patient Active Problem List   Diagnosis Date Noted  . Lumbar stenosis with neurogenic claudication 06/11/2015  . Renal colic 46/80/3212  . Back ache 11/26/2014  . Benign essential HTN 11/26/2014  . DDD (degenerative disc disease), cervical 11/26/2014  . DDD (degenerative disc disease), lumbosacral 11/26/2014  . 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 Bilateral tinnitus 11/26/2014  . 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 H/O malignant neoplasm of skin 06/20/2012    Rosalyn Gess OTR/L,CLT 04/06/2017, 1:50 PM  [facility] [address], [place], [address] Phone: [phone]   Fax:  [phone]  Name: [name] MRN: [id number] Date of Birth: 1945/08/12

## 2017-04-13 ENCOUNTER — Ambulatory Visit: Payer: Medicare Other | Attending: Orthopedic Surgery | Admitting: Occupational Therapy

## 2017-04-13 DIAGNOSIS — M25542 Pain in joints of left hand: Secondary | ICD-10-CM | POA: Insufficient documentation

## 2017-04-13 DIAGNOSIS — M25532 Pain in left wrist: Secondary | ICD-10-CM | POA: Diagnosis present

## 2017-04-13 NOTE — Therapy (Signed)
[facility] [address], [place], [address] Phone: [phone]   Fax:  [phone]  Occupational Therapy Treatment  Patient Details  Name: [name] MRN: [id number] Date of Birth: 04/27/1946 Referring Provider: Dr Rudene Christians  Encounter Date: 04/13/2017      OT End of Session - 04/13/17 1232    Visit Number 2   Number of Visits 2   Date for OT Re-Evaluation 04/13/17   OT Start Time 1148   OT Stop Time 1226   OT Time Calculation (min) 38 min   Activity Tolerance Patient tolerated treatment well   Behavior During Therapy [facility] for tasks assessed/performed      Past Medical History:  Diagnosis Date  . Back ache 11/26/2014  . Benign essential HTN 11/26/2014  . Bilateral tinnitus 11/26/2014  . Cancer ([place])    basal cell carcinoma on nose  . DDD (degenerative disc disease), cervical 11/26/2014  . 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 DVT (deep venous thrombosis) ([place])   . Gout   . H/O malignant neoplasm of skin 06/20/2012  . Hemorrhoids   . History of anemia   . History of kidney stones   . History of pneumonia   . Numbness and tingling   . 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 PONV (postoperative nausea and vomiting)   . Renal colic 11/05/621    Past Surgical History:  Procedure Laterality Date  . BREAST BIOPSY Right    Patient states she had a lump removed  . CHOLECYSTECTOMY    . CYSTOSCOPY/RETROGRADE/URETEROSCOPY    . EYE SURGERY Left   . KNEE SURGERY Left   . lower back surgery    . NECK SURGERY     x4  . RETINAL DETACHMENT SURGERY Left   . SKIN CANCER EXCISION     nose  . TONSILLECTOMY    . TUBAL LIGATION    . WISDOM TOOTH EXTRACTION      There were no vitals filed for this visit.      Subjective Assessment - 04/13/17 1230    Subjective  Doing much better -I love this soft splint for my thumb  - I am changing the way I use my hands  and palms - did adapt the way I use my hands -  and getting springloaded scissors - you helped me a lot    Patient Stated Goals What adaptive equipment is available or adaptations to make some of the tasks easier and less stress on my hands - to decrease the pain    Currently in Pain? No/denies            [facility] OT Assessment - 04/13/17 0001      AROM   Left Wrist Extension 68 Degrees   Left Wrist Flexion 85 Degrees   Left Wrist Radial Deviation 30 Degrees   Left Wrist Ulnar Deviation 28 Degrees     Strength   Right Hand Grip (lbs) 59   Right Hand Lateral Pinch 16 lbs   Right Hand 3 Point Pinch 14 lbs   Left Hand Grip (lbs) 40  45 lbs with splint    Left Hand Lateral Pinch 13 lbs   Left Hand 3 Point Pinch 13 lbs     Pt show decrease edema in L wrist and thumb  Negative for tenderness over distal radius head, and Finkelstein  Decrease pain significantly with use - still wearing CMC neoprene splint with function  Measurements taken -  and with [place] for grip   No pain with above assessment   Review again Joint protection and AE - hand out provided last time  Pt already modifying act - using larger joint , built up handles , avoid tight and sustained grip  Using pizza cutter for cutting  And got bra that fasten in front  Planning to get spring loaded scissors   Pt report she knows what to do and happy with progress                        OT Education - 04/13/17 1232    Education provided Yes   Education Details homeprogram , joint protection and AE    Person(s) Educated Patient   Methods Explanation;Demonstration;Tactile cues;Verbal cues   Comprehension Verbal cues required;Returned demonstration;Verbalized understanding             OT Long Term Goals - 04/13/17 1234      OT LONG TERM GOAL #1   Title Pain on PRWHE improve with more than 15 points    Baseline pain decrease to 15/50    Status Achieved     OT LONG TERM GOAL #2   Title Pt to verbalize and demo 3 AE and joint protections  principles that she implemented to decrease pain    Status Achieved               Plan - 04/13/17 1233    Clinical Impression Statement Pt made great progress in week since evaluation was done - negative for DeQuervain symptoms - increase grip and prehension strength and wrist AROM with decrease pain - report decrease edema over thumb - increase use and ease of performing tasks - as well as implementing  AE and joint protection    Occupational performance deficits (Please refer to evaluation for details): ADL's;IADL's;Play;Leisure   Plan discharge with homeprogram   Clinical Decision Making Limited treatment options, no task modification necessary   OT Home Exercise Plan see pt instruction   Consulted and Agree with Plan of Care Patient      Patient will benefit from skilled therapeutic intervention in order to improve the following deficits and impairments:     Visit Diagnosis: Pain in joint of left hand  Pain in left wrist    Problem List Patient Active Problem List   Diagnosis Date Noted  . Lumbar stenosis with neurogenic claudication 06/11/2015  . Renal colic 64/33/2951  . Back ache 11/26/2014  . Benign essential HTN 11/26/2014  . DDD (degenerative disc disease), cervical 11/26/2014  . DDD (degenerative disc disease), lumbosacral 11/26/2014  . 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 Bilateral tinnitus 11/26/2014  . 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 H/O malignant neoplasm of skin 06/20/2012    Rosalyn Gess OTR/L,CLT  04/13/2017, 12:35 PM  [facility] [address], [place], [address] Phone: [phone]   Fax:  [phone]  Name: [name] MRN: [id number] Date of Birth: 11/17/1945

## 2017-04-13 NOTE — Patient Instructions (Signed)
Cont with same HEP than did the last week  CMC neoprene splint

## 2017-09-06 ENCOUNTER — Encounter (INDEPENDENT_AMBULATORY_CARE_PROVIDER_SITE_OTHER): Payer: Self-pay | Admitting: Vascular Surgery

## 2017-09-06 ENCOUNTER — Ambulatory Visit (INDEPENDENT_AMBULATORY_CARE_PROVIDER_SITE_OTHER): Payer: Medicare Other | Admitting: Vascular Surgery

## 2017-09-06 VITALS — BP 148/81 | HR 81 | Resp 17 | Ht 66.5 in | Wt 180.0 lb

## 2017-09-06 DIAGNOSIS — M5137 Other intervertebral disc degeneration, lumbosacral region: Secondary | ICD-10-CM | POA: Diagnosis not present

## 2017-09-06 DIAGNOSIS — I83811 Varicose veins of right lower extremities with pain: Secondary | ICD-10-CM | POA: Insufficient documentation

## 2017-09-06 DIAGNOSIS — I10 Essential (primary) hypertension: Secondary | ICD-10-CM

## 2017-09-06 NOTE — Progress Notes (Signed)
Patient ID: [name], female   DOB: Oct 16, 1945, 72 y.o.   MRN: [id number]  Chief Complaint  Patient presents with  . New Patient (Initial Visit)    Right Leg Pain    HPI [name] is a 72 y.o. female.  The patient presents with complaints of symptomatic varicosities and swelling of the right leg. The patient reports a long standing history of varicosities and they have become painful over time.  She had a laser ablation of what sounds like the right small saphenous vein 2-3 years ago but had to truncate her venous treatment because of back troubles and need for a somewhat urgent back surgery.  There was no clear inciting event or causative factor that started the symptoms more recently.  The right leg is more severly affected. The patient elevates the legs for relief. The pain is described as stinging and stabbing particularly in the lower leg. The symptoms are generally most severe in the evening, particularly when they have been on their feet for long periods of time.  Anti-inflammatories such as ibuprofen and elevation have been used to try to improve the symptoms with some success. The patient complains of worsening swelling as an associated symptom. The patient has no previous history of deep venous thrombosis or superficial thrombophlebitis to their knowledge.     Past Medical History:  Diagnosis Date  . Back ache 11/26/2014  . Benign essential HTN 11/26/2014  . Bilateral tinnitus 11/26/2014  . Cancer ([place])    basal cell carcinoma on nose  . DDD (degenerative disc disease), cervical 11/26/2014  . 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 DVT (deep venous thrombosis) ([place])   . Gout   . H/O malignant neoplasm of skin 06/20/2012  . Hemorrhoids   . History of anemia   . History of kidney stones   . History of pneumonia   . Numbness and tingling   . 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 PONV (postoperative nausea and vomiting)   . Renal colic 7/37/1062    Past Surgical  History:  Procedure Laterality Date  . BREAST BIOPSY Right    Patient states she had a lump removed  . CHOLECYSTECTOMY    . CYSTOSCOPY/RETROGRADE/URETEROSCOPY    . EYE SURGERY Left   . KNEE SURGERY Left   . lower back surgery    . NECK SURGERY     x4  . RETINAL DETACHMENT SURGERY Left   . SKIN CANCER EXCISION     nose  . TONSILLECTOMY    . TUBAL LIGATION    . WISDOM TOOTH EXTRACTION      Family History  Problem Relation Age of Onset  . Breast cancer Paternal Aunt   . Breast cancer Cousin   Brother died of blood clots to the brain No bleeding disorders No porphyria  Social History Social History   Tobacco Use  . Smoking status: Never Smoker  . Smokeless tobacco: Never Used  Substance Use Topics  . Alcohol use: No    Alcohol/week: 0.0 oz  . Drug use: No  Married  Allergies  Allergen Reactions  . Iodinated Diagnostic Agents Anaphylaxis  . Iodine Anaphylaxis  . Clarithromycin Other (See Comments)    Generalized weakness Heart feels funny  . Codeine Nausea Only    Current Outpatient Medications  Medication Sig Dispense Refill  . amLODipine-benazepril (LOTREL) 5-10 MG per capsule Take by mouth.    . Biotin 1 MG CAPS Take 1 mg by mouth daily.    Marland Kitchen  Cholecalciferol (VITAMIN D-1000 MAX ST) 1000 UNITS tablet Take 2,000 Units by mouth daily.     . cyclobenzaprine (FLEXERIL) 10 MG tablet Take 1 tablet (10 mg total) by mouth 3 (three) times daily as needed for muscle spasms. 50 tablet 1  . docusate sodium (COLACE) 100 MG capsule Take 1 capsule (100 mg total) by mouth 2 (two) times daily. 60 capsule 0  . FIBER PO Take 1 tablet by mouth daily.    . Flaxseed, Linseed, (FLAXSEED OIL) 1000 MG CAPS Take 1,000 mg by mouth daily.     Marland Kitchen gabapentin (NEURONTIN) 400 MG capsule Take 400-800 mg by mouth 3 (three) times daily. 400 in the morning, 400 in the afternoon and 800mg  at bedtime    . Garlic 4696 MG CAPS Take 1,000 mg by mouth daily.     . hydrochlorothiazide (HYDRODIURIL) 25  MG tablet Take 12.5 mg by mouth daily.     Marland Kitchen HYDROMET 5-1.5 MG/5ML syrup TK 5 ML PO Q 6 H PRN  0  . Multiple Vitamin (MULTI-VITAMINS) TABS Take 1 tablet by mouth daily.     Vladimir Faster Glycol-Propyl Glycol (SYSTANE ULTRA OP) Apply 1 drop to eye as needed (dry eyes).    . triamterene-hydrochlorothiazide (MAXZIDE-25) 37.5-25 MG per tablet Take 1 tablet by mouth daily.     . benazepril (LOTENSIN) 20 MG tablet Take by mouth.    . colchicine 0.6 MG tablet Take 0.6 mg by mouth daily as needed (gout).    . Omega-3 1000 MG CAPS Take 1,000 mg by mouth daily.     Marland Kitchen oxyCODONE-acetaminophen (PERCOCET) 10-325 MG tablet Take 1 tablet by mouth every 4 (four) hours as needed for pain. (Patient not taking: Reported on 09/06/2017) 100 tablet 0   No current facility-administered medications for this visit.       REVIEW OF SYSTEMS (Negative unless checked)  Constitutional: [] Weight loss  [] Fever  [] Chills Cardiac: [] Chest pain   [] Chest pressure   [] Palpitations   [] Shortness of breath when laying flat   [] Shortness of breath at rest   [] Shortness of breath with exertion. Vascular:  [] Pain in legs with walking   [x] Pain in legs at rest   [] Pain in legs when laying flat   [] Claudication   [] Pain in feet when walking  [] Pain in feet at rest  [] Pain in feet when laying flat   [] History of DVT   [] Phlebitis   [x] Swelling in legs   [x] Varicose veins   [] Non-healing ulcers Pulmonary:   [] Uses home oxygen   [] Productive cough   [] Hemoptysis   [] Wheeze  [] COPD   [] Asthma Neurologic:  [] Dizziness  [] Blackouts   [] Seizures   [] History of stroke   [] History of TIA  [] Aphasia   [] Temporary blindness   [] Dysphagia   [] Weakness or numbness in arms   [] Weakness or numbness in legs Musculoskeletal:  [x] Arthritis   [] Joint swelling   [x] Joint pain   [x] Low back pain Hematologic:  [] Easy bruising  [] Easy bleeding   [] Hypercoagulable state   [] Anemic  [] Hepatitis Gastrointestinal:  [] Blood in stool   [] Vomiting blood   [] Gastroesophageal reflux/heartburn   [] Abdominal pain Genitourinary:  [] Chronic kidney disease   [] Difficult urination  [] Frequent urination  [] Burning with urination   [] Hematuria Skin:  [] Rashes   [] Ulcers   [] Wounds Psychological:  [] History of anxiety   []  History of major depression.    Physical Exam BP (!) 148/81 (BP Location: Right Arm, Patient Position: Sitting)   Pulse 81   Resp 17  Ht 5' 6.5" (1.689 m)   Wt 81.6 kg (180 lb)   BMI 28.62 kg/m  Gen:  WD/WN, NAD Head: [place]/AT, No temporalis wasting.  Ear/Nose/Throat: Hearing grossly intact, dentition good Eyes: Sclera non-icteric. Conjunctiva clear Neck: Supple, no nuchal rigidity. Trachea midline Pulmonary:  Good air movement, no use of accessory muscles, respirations not labored.  Cardiac: RRR, No JVD Vascular: Varicosities diffuse and measuring up to 2-3 mm in the right lower extremity        Varicosities diffuse and measuring up to 2 mm in the left lower extremity Vessel Right Left  Radial Palpable Palpable                          PT Palpable Palpable  DP Palpable Palpable    Musculoskeletal: M/S 5/5 throughout.   1 + RLE edema.  No LLE edema Neurologic: Sensation grossly intact in extremities.  Symmetrical.  Speech is fluent.  Psychiatric: Judgment intact, Mood & affect appropriate for pt's clinical situation. Dermatologic: No rashes or ulcers noted.  No cellulitis or open wounds.    Radiology No results found.  Labs No results found for this or any previous visit (from the past 2160 hour(s)).  Assessment/Plan:  Benign essential HTN blood pressure control important in reducing the progression of atherosclerotic disease. On appropriate oral medications.   DDD (degenerative disc disease), lumbosacral Status post surgery.  This could certainly be contributing to her lower extremity symptoms.  Varicose veins of leg with pain, right   The patient has symptoms consistent with chronic venous  insufficiency. We discussed the natural history and treatment options for venous disease. I recommended the regular use of 20 - 30 mm Hg compression stockings, and prescribed these today. I recommended leg elevation and anti-inflammatories as needed for pain. I have also recommended a complete venous duplex to assess the venous system for reflux or thrombotic issues. This can be done at the patient's convenience. I will see the patient back after the duplex to assess the response to conservative management, and determine further treatment options.     Leotis Pain 09/06/2017, 2:04 PM   This note was created with Dragon medical transcription system.  Any errors from dictation are unintentional.

## 2017-09-06 NOTE — Assessment & Plan Note (Signed)
blood pressure control important in reducing the progression of atherosclerotic disease. On appropriate oral medications.  

## 2017-09-06 NOTE — Assessment & Plan Note (Signed)
Status post surgery.  This could certainly be contributing to her lower extremity symptoms.

## 2017-09-06 NOTE — Patient Instructions (Signed)

## 2017-09-15 ENCOUNTER — Other Ambulatory Visit: Payer: Self-pay | Admitting: Internal Medicine

## 2017-09-15 DIAGNOSIS — Z1231 Encounter for screening mammogram for malignant neoplasm of breast: Secondary | ICD-10-CM

## 2017-09-28 ENCOUNTER — Ambulatory Visit
Admission: RE | Admit: 2017-09-28 | Discharge: 2017-09-28 | Disposition: A | Discharge status: Home / Self Care | Payer: Medicare Other | Source: Ambulatory Visit | Attending: Internal Medicine | Admitting: Internal Medicine

## 2017-09-28 DIAGNOSIS — Z1231 Encounter for screening mammogram for malignant neoplasm of breast: Secondary | ICD-10-CM | POA: Insufficient documentation

## 2017-10-11 ENCOUNTER — Ambulatory Visit (INDEPENDENT_AMBULATORY_CARE_PROVIDER_SITE_OTHER): Payer: Medicare Other

## 2017-10-11 ENCOUNTER — Ambulatory Visit (INDEPENDENT_AMBULATORY_CARE_PROVIDER_SITE_OTHER): Payer: Medicare Other | Admitting: Vascular Surgery

## 2017-10-11 ENCOUNTER — Encounter (INDEPENDENT_AMBULATORY_CARE_PROVIDER_SITE_OTHER): Payer: Self-pay | Admitting: Vascular Surgery

## 2017-10-11 VITALS — BP 125/70 | HR 74 | Resp 16 | Ht 66.5 in | Wt 181.0 lb

## 2017-10-11 DIAGNOSIS — I10 Essential (primary) hypertension: Secondary | ICD-10-CM | POA: Diagnosis not present

## 2017-10-11 DIAGNOSIS — I83811 Varicose veins of right lower extremities with pain: Secondary | ICD-10-CM

## 2017-10-11 NOTE — Progress Notes (Signed)
MRN : [id number]  [name] is a 72 y.o. ([birth year]) female who presents with chief complaint of  Chief Complaint  Patient presents with  . Follow-up    pt conv right ven reflux  .  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  She has previously undergone a laser ablation on that right side.  She wears compression stockings every day and his symptoms are reasonably stable.  Her venous duplex today demonstrates persistent ablation of the majority of the right great saphenous vein except in the proximal thigh where it feeds a large anterior accessory saphenous vein with reflux.  No DVT or superficial thrombophlebitis was identified.       Past Medical History:  Diagnosis Date  . Back ache 11/26/2014  . Benign essential HTN 11/26/2014  . Bilateral tinnitus 11/26/2014  . Cancer ([place])    basal cell carcinoma on nose  . DDD (degenerative disc disease), cervical 11/26/2014  . 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 DVT (deep venous thrombosis) ([place])   . Gout   . H/O malignant neoplasm of skin 06/20/2012  . Hemorrhoids   . History of anemia   . History of kidney stones   . History of pneumonia   . Numbness and tingling   . 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 PONV (postoperative nausea and vomiting)   . Renal colic 5/00/3704         Past Surgical History:  Procedure Laterality Date  . BREAST BIOPSY Right    Patient states she had a lump removed  . CHOLECYSTECTOMY    . CYSTOSCOPY/RETROGRADE/URETEROSCOPY    . EYE SURGERY Left   . KNEE SURGERY Left   . lower back surgery    . NECK SURGERY     x4  . RETINAL DETACHMENT SURGERY Left   . SKIN CANCER EXCISION     nose  . TONSILLECTOMY    . TUBAL LIGATION    . WISDOM TOOTH EXTRACTION           Family History  Problem Relation Age of Onset  . Breast cancer Paternal Aunt   . Breast cancer Cousin   Brother died  of blood clots to the brain No bleeding disorders No porphyria  Social History Social History        Tobacco Use  . Smoking status: Never Smoker  . Smokeless tobacco: Never Used  Substance Use Topics  . Alcohol use: No    Alcohol/week: 0.0 oz  . Drug use: No  Married       Allergies  Allergen Reactions  . Iodinated Diagnostic Agents Anaphylaxis  . Iodine Anaphylaxis  . Clarithromycin Other (See Comments)    Generalized weakness Heart feels funny  . Codeine Nausea Only          Current Outpatient Medications  Medication Sig Dispense Refill  . amLODipine-benazepril (LOTREL) 5-10 MG per capsule Take by mouth.    . Biotin 1 MG CAPS Take 1 mg by mouth daily.    . Cholecalciferol (VITAMIN D-1000 MAX ST) 1000 UNITS tablet Take 2,000 Units by mouth daily.     . cyclobenzaprine (FLEXERIL) 10 MG tablet Take 1 tablet (10 mg total) by mouth 3 (three) times daily as needed for muscle spasms. 50 tablet 1  . docusate sodium (COLACE) 100 MG capsule Take 1 capsule (100 mg total) by mouth 2 (two) times daily. 60 capsule 0  . FIBER PO  Take 1 tablet by mouth daily.    . Flaxseed, Linseed, (FLAXSEED OIL) 1000 MG CAPS Take 1,000 mg by mouth daily.     Marland Kitchen gabapentin (NEURONTIN) 400 MG capsule Take 400-800 mg by mouth 3 (three) times daily. 400 in the morning, 400 in the afternoon and 800mg  at bedtime    . Garlic 1884 MG CAPS Take 1,000 mg by mouth daily.     . hydrochlorothiazide (HYDRODIURIL) 25 MG tablet Take 12.5 mg by mouth daily.     Marland Kitchen HYDROMET 5-1.5 MG/5ML syrup TK 5 ML PO Q 6 H PRN  0  . Multiple Vitamin (MULTI-VITAMINS) TABS Take 1 tablet by mouth daily.     Vladimir Faster Glycol-Propyl Glycol (SYSTANE ULTRA OP) Apply 1 drop to eye as needed (dry eyes).    . triamterene-hydrochlorothiazide (MAXZIDE-25) 37.5-25 MG per tablet Take 1 tablet by mouth daily.     . benazepril (LOTENSIN) 20 MG tablet Take by mouth.    . colchicine 0.6 MG tablet Take 0.6 mg by  mouth daily as needed (gout).    . Omega-3 1000 MG CAPS Take 1,000 mg by mouth daily.     Marland Kitchen oxyCODONE-acetaminophen (PERCOCET) 10-325 MG tablet Take 1 tablet by mouth every 4 (four) hours as needed for pain. (Patient not taking: Reported on 09/06/2017) 100 tablet 0   No current facility-administered medications for this visit.       REVIEW OF SYSTEMS (Negative unless checked)  Constitutional: [] Weight loss  [] Fever  [] Chills Cardiac: [] Chest pain   [] Chest pressure   [] Palpitations   [] Shortness of breath when laying flat   [] Shortness of breath at rest   [] Shortness of breath with exertion. Vascular:  [] Pain in legs with walking   [x] Pain in legs at rest   [] Pain in legs when laying flat   [] Claudication   [] Pain in feet when walking  [] Pain in feet at rest  [] Pain in feet when laying flat   [] History of DVT   [] Phlebitis   [x] Swelling in legs   [x] Varicose veins   [] Non-healing ulcers Pulmonary:   [] Uses home oxygen   [] Productive cough   [] Hemoptysis   [] Wheeze  [] COPD   [] Asthma Neurologic:  [] Dizziness  [] Blackouts   [] Seizures   [] History of stroke   [] History of TIA  [] Aphasia   [] Temporary blindness   [] Dysphagia   [] Weakness or numbness in arms   [] Weakness or numbness in legs Musculoskeletal:  [x] Arthritis   [] Joint swelling   [x] Joint pain   [x] Low back pain Hematologic:  [] Easy bruising  [] Easy bleeding   [] Hypercoagulable state   [] Anemic  [] Hepatitis Gastrointestinal:  [] Blood in stool   [] Vomiting blood  [] Gastroesophageal reflux/heartburn   [] Abdominal pain Genitourinary:  [] Chronic kidney disease   [] Difficult urination  [] Frequent urination  [] Burning with urination   [] Hematuria Skin:  [] Rashes   [] Ulcers   [] Wounds Psychological:  [] History of anxiety   []  History of major depression.      Physical Examination  BP 125/70 (BP Location: Right Arm)   Pulse 74   Resp 16   Ht 5' 6.5" (1.689 m)   Wt 82.1 kg (181 lb)   BMI 28.78 kg/m  Gen:  WD/WN, NAD.  Appears younger than stated age. Head: [place]/AT, No temporalis wasting. Ear/Nose/Throat: Hearing grossly intact Eyes: Conjunctiva clear. Sclera non-icteric Neck: Supple. Trachea midline Pulmonary:  Good air movement, no use of accessory muscles.  Cardiac: RRR, no JVD Vascular: Prominent varicosities throughout the right lower extremity measuring 2-3  mm in diameter Vessel Right Left  Radial Palpable Palpable                          PT  1+ palpable Palpable  DP Palpable Palpable    Musculoskeletal: M/S 5/5 throughout.  No deformity or atrophy.  1+ right lower extremity edema. Neurologic: Sensation grossly intact in extremities.  Symmetrical.  Speech is fluent.  Psychiatric: Judgment intact, Mood & affect appropriate for pt's clinical situation. Dermatologic: No rashes or ulcers noted.  No cellulitis or open wounds.       Labs No results found for this or any previous visit (from the past 2160 hour(s)).  Radiology Mm Digital Screening Bilateral  Result Date: 09/28/2017 CLINICAL DATA:  Screening. EXAM: DIGITAL SCREENING BILATERAL MAMMOGRAM WITH CAD COMPARISON:  Previous exam(s). ACR Breast Density Category b: There are scattered areas of fibroglandular density. FINDINGS: There are no findings suspicious for malignancy. Images were processed with CAD. IMPRESSION: No mammographic evidence of malignancy. A result letter of this screening mammogram will be mailed directly to the patient. RECOMMENDATION: Screening mammogram in one year. (Code:SM-B-01Y) BI-RADS CATEGORY  1: Negative. Electronically Signed   By: Fidela Salisbury M.D.   On: 09/28/2017 17:37    Assessment/Plan Benign essential HTN blood pressure control important in reducing the progression of atherosclerotic disease. On appropriate oral medications.   DDD (degenerative disc disease), lumbosacral Status post surgery.  This could certainly be contributing to her lower extremity    Varicose veins of leg with pain,  right Her venous duplex today demonstrates persistent ablation of the majority of the right great saphenous vein except in the proximal thigh where it feeds a large anterior accessory saphenous vein with reflux.  No DVT or superficial thrombophlebitis was identified. Given this finding, I think the best course of treatment would be foam sclerotherapy of the right lower extremity.  The large anterior accessory saphenous veins are not usually amenable to laser ablation.  I discussed the risks and benefits of the procedures.  The patient is agreeable to proceed.    Leotis Pain, MD  10/11/2017 4:02 PM    This note was created with Dragon medical transcription system.  Any errors from dictation are purely unintentional

## 2017-10-11 NOTE — Assessment & Plan Note (Signed)
Her venous duplex today demonstrates persistent ablation of the majority of the right great saphenous vein except in the proximal thigh where it feeds a large anterior accessory saphenous vein with reflux.  No DVT or superficial thrombophlebitis was identified. Given this finding, I think the best course of treatment would be foam sclerotherapy of the right lower extremity.  The large anterior accessory saphenous veins are not usually amenable to laser ablation.  I discussed the risks and benefits of the procedures.  The patient is agreeable to proceed.

## 2017-10-11 NOTE — Patient Instructions (Signed)
Varicose Vein Surgery, Care After Refer to this sheet in the next few weeks. These instructions provide you with information about caring for yourself after your procedure. Your health care provider may also give you more specific instructions. Your treatment has been planned according to current medical practices, but problems sometimes occur. Call your health care provider if you have any problems or questions after your procedure. What can I expect after the procedure? After your procedure, it is typical to have the following:  Swelling.  Bruising.  Soreness.  Mild skin discoloration.  Slight bleeding at incision sites.  Follow these instructions at home:  Take medicines only as directed by your health care provider.  Wear compression stockings as directed by your health care provider. These stockings help to prevent blood clots and reduce swelling in your legs.  There are many different ways to close and cover an incision, including stitches (sutures), skin glue, and adhesive strips. Follow your health care provider's instructions on: ? Incision care. ? Bandage (dressing) changes and removal. ? Incision closure removal.  Wear loose-fitting clothing.  Get regular daily exercise. Walk or ride a stationary bike daily or as directed by your health care provider.  Ask your health care provider when you can return to work. This may depend on the type of work you do.  Be patient with your recovery. It can take up to 4 weeks to get back to your usual activities. Contact a health care provider if:  You have a fever.  You have drainage, redness, swelling, or pain at an incision site.  You develop a cough. Get help right away if:  You pass out.  You have very bad pain in your leg.  You have leg pain that gets worse when you walk.  You have redness or swelling in your leg that is getting worse.  You have trouble breathing.  You cough up blood. This information is not  intended to replace advice given to you by your health care provider. Make sure you discuss any questions you have with your health care provider. Document Released: 03/01/2014 Document Revised: 12/04/2015 Document Reviewed: 12/05/2013 Elsevier Interactive Patient Education  2018 [employer]  

## 2017-11-15 ENCOUNTER — Encounter (INDEPENDENT_AMBULATORY_CARE_PROVIDER_SITE_OTHER): Payer: Self-pay | Admitting: Vascular Surgery

## 2017-11-15 ENCOUNTER — Ambulatory Visit (INDEPENDENT_AMBULATORY_CARE_PROVIDER_SITE_OTHER): Payer: Medicare Other | Admitting: Vascular Surgery

## 2017-11-15 VITALS — BP 137/70 | HR 67 | Resp 16 | Ht 66.0 in | Wt 181.0 lb

## 2017-11-15 DIAGNOSIS — I83811 Varicose veins of right lower extremities with pain: Secondary | ICD-10-CM

## 2017-11-15 NOTE — Progress Notes (Signed)
[name] is a 72 y.o.female who presents with painful varicose veins of the right leg  Past Medical History:  Diagnosis Date  . Back ache 11/26/2014  . Benign essential HTN 11/26/2014  . Bilateral tinnitus 11/26/2014  . Cancer ([place])    basal cell carcinoma on nose  . DDD (degenerative disc disease), cervical 11/26/2014  . 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 DVT (deep venous thrombosis) ([place])   . Gout   . H/O malignant neoplasm of skin 06/20/2012  . Hemorrhoids   . History of anemia   . History of kidney stones   . History of pneumonia   . Numbness and tingling   . 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 PONV (postoperative nausea and vomiting)   . Renal colic 9/32/6712    Past Surgical History:  Procedure Laterality Date  . BREAST EXCISIONAL BIOPSY Right 2012  . CHOLECYSTECTOMY    . CYSTOSCOPY/RETROGRADE/URETEROSCOPY    . EYE SURGERY Left   . KNEE SURGERY Left   . lower back surgery    . NECK SURGERY     x4  . RETINAL DETACHMENT SURGERY Left   . SKIN CANCER EXCISION     nose  . TONSILLECTOMY    . TUBAL LIGATION    . WISDOM TOOTH EXTRACTION      Current Outpatient Medications  Medication Sig Dispense Refill  . amLODipine-benazepril (LOTREL) 5-10 MG per capsule Take by mouth.    . benazepril (LOTENSIN) 20 MG tablet Take by mouth.    . Biotin 1 MG CAPS Take 1 mg by mouth daily.    . Cholecalciferol (VITAMIN D-1000 MAX ST) 1000 UNITS tablet Take 2,000 Units by mouth daily.     . colchicine 0.6 MG tablet Take 0.6 mg by mouth daily as needed (gout).    . cyclobenzaprine (FLEXERIL) 10 MG tablet Take 1 tablet (10 mg total) by mouth 3 (three) times daily as needed for muscle spasms. 50 tablet 1  . docusate sodium (COLACE) 100 MG capsule Take 1 capsule (100 mg total) by mouth 2 (two) times daily. 60 capsule 0  . famotidine (PEPCID) 10 MG tablet Take 10 mg by mouth as needed for heartburn or indigestion.    Marland Kitchen FIBER PO Take 1 tablet by mouth daily.    . Flaxseed,  Linseed, (FLAXSEED OIL) 1000 MG CAPS Take 1,000 mg by mouth daily.     . furosemide (LASIX) 20 MG tablet   0  . gabapentin (NEURONTIN) 400 MG capsule Take 400-800 mg by mouth 3 (three) times daily. 400 in the morning, 400 in the afternoon and 800mg  at bedtime    . Garlic 4580 MG CAPS Take 1,000 mg by mouth daily.     . hydrochlorothiazide (HYDRODIURIL) 25 MG tablet Take 12.5 mg by mouth daily.     Marland Kitchen HYDROMET 5-1.5 MG/5ML syrup TK 5 ML PO Q 6 H PRN  0  . Multiple Vitamin (MULTI-VITAMINS) TABS Take 1 tablet by mouth daily.     . Omega-3 1000 MG CAPS Take 1,000 mg by mouth daily.     Marland Kitchen oxyCODONE-acetaminophen (PERCOCET) 10-325 MG tablet Take 1 tablet by mouth every 4 (four) hours as needed for pain. (Patient not taking: Reported on 09/06/2017) 100 tablet 0  . Polyethyl Glycol-Propyl Glycol (SYSTANE ULTRA OP) Apply 1 drop to eye as needed (dry eyes).    . psyllium (METAMUCIL) 58.6 % powder Take 1 packet by mouth daily.    Marland Kitchen triamterene-hydrochlorothiazide (MAXZIDE-25) 37.5-25 MG per tablet Take  1 tablet by mouth daily.      No current facility-administered medications for this visit.     Allergies  Allergen Reactions  . Bee Venom Anaphylaxis  . Iodinated Diagnostic Agents Anaphylaxis  . Iodine Anaphylaxis  . Clarithromycin Other (See Comments)    Generalized weakness Heart feels funny  . Codeine Nausea Only    Indication: Patient presents with symptomatic varicose veins of the right lower extremity.  Procedure: Foam sclerotherapy was performed on the right lower extremity. Using ultrasound guidance, 5 mL of foam Sotradecol was used to inject the varicosities of the right lower extremity. Compression wraps were placed. The patient tolerated the procedure well.

## 2017-12-23 ENCOUNTER — Encounter: Payer: Self-pay | Admitting: *Deleted

## 2017-12-26 ENCOUNTER — Ambulatory Visit
Admission: RE | Admit: 2017-12-26 | Discharge: 2017-12-26 | Disposition: A | Discharge status: Home / Self Care | Payer: Medicare Other | Source: Ambulatory Visit | Attending: Unknown Physician Specialty | Admitting: Unknown Physician Specialty

## 2017-12-26 ENCOUNTER — Ambulatory Visit: Payer: Medicare Other | Admitting: Anesthesiology

## 2017-12-26 ENCOUNTER — Encounter: Payer: Self-pay | Admitting: *Deleted

## 2017-12-26 ENCOUNTER — Encounter
Admission: RE | Disposition: A | Discharge status: Home / Self Care | Payer: Self-pay | Source: Ambulatory Visit | Attending: Unknown Physician Specialty

## 2017-12-26 DIAGNOSIS — Z91041 Radiographic dye allergy status: Secondary | ICD-10-CM | POA: Diagnosis not present

## 2017-12-26 DIAGNOSIS — Z1211 Encounter for screening for malignant neoplasm of colon: Secondary | ICD-10-CM | POA: Diagnosis not present

## 2017-12-26 DIAGNOSIS — Z885 Allergy status to narcotic agent status: Secondary | ICD-10-CM | POA: Insufficient documentation

## 2017-12-26 DIAGNOSIS — I10 Essential (primary) hypertension: Secondary | ICD-10-CM | POA: Insufficient documentation

## 2017-12-26 DIAGNOSIS — Z888 Allergy status to other drugs, medicaments and biological substances status: Secondary | ICD-10-CM | POA: Insufficient documentation

## 2017-12-26 DIAGNOSIS — K5730 Diverticulosis of large intestine without perforation or abscess without bleeding: Secondary | ICD-10-CM | POA: Diagnosis not present

## 2017-12-26 DIAGNOSIS — Z86718 Personal history of other venous thrombosis and embolism: Secondary | ICD-10-CM | POA: Diagnosis not present

## 2017-12-26 DIAGNOSIS — Z86010 Personal history of colonic polyps: Secondary | ICD-10-CM | POA: Diagnosis present

## 2017-12-26 DIAGNOSIS — Z800 Family history of malignant neoplasm of digestive organs: Secondary | ICD-10-CM | POA: Diagnosis not present

## 2017-12-26 DIAGNOSIS — Z79899 Other long term (current) drug therapy: Secondary | ICD-10-CM | POA: Insufficient documentation

## 2017-12-26 DIAGNOSIS — Z85828 Personal history of other malignant neoplasm of skin: Secondary | ICD-10-CM | POA: Diagnosis not present

## 2017-12-26 DIAGNOSIS — Z87892 Personal history of anaphylaxis: Secondary | ICD-10-CM | POA: Insufficient documentation

## 2017-12-26 DIAGNOSIS — K640 First degree hemorrhoids: Secondary | ICD-10-CM | POA: Diagnosis not present

## 2017-12-26 HISTORY — PX: COLONOSCOPY WITH PROPOFOL: SHX5780

## 2017-12-26 HISTORY — DX: Gastro-esophageal reflux disease without esophagitis: K21.9

## 2017-12-26 HISTORY — PX: ESOPHAGOGASTRODUODENOSCOPY: SHX5428

## 2017-12-26 SURGERY — COLONOSCOPY WITH PROPOFOL
Anesthesia: General

## 2017-12-26 MED ORDER — SODIUM CHLORIDE 0.9 % IV SOLN: INTRAVENOUS | Status: DC

## 2017-12-26 MED ORDER — MIDAZOLAM HCL 2 MG/2ML IJ SOLN
INTRAMUSCULAR | Status: AC
  Filled 2017-12-26: qty 2

## 2017-12-26 MED ORDER — LIDOCAINE HCL (PF) 2 % IJ SOLN
INTRAMUSCULAR | Status: AC
  Filled 2017-12-26: qty 10

## 2017-12-26 MED ORDER — PHENYLEPHRINE HCL 10 MG/ML IJ SOLN
INTRAMUSCULAR | Status: DC | PRN
  Administered 2017-12-26: 200 ug via INTRAVENOUS
  Administered 2017-12-26: 100 ug via INTRAVENOUS

## 2017-12-26 MED ORDER — SODIUM CHLORIDE 0.9 % IV SOLN
INTRAVENOUS | Status: DC
  Administered 2017-12-26: 1000 mL via INTRAVENOUS

## 2017-12-26 MED ORDER — PROPOFOL 500 MG/50ML IV EMUL
INTRAVENOUS | Status: DC | PRN
  Administered 2017-12-26: 50 ug/kg/min via INTRAVENOUS

## 2017-12-26 MED ORDER — LIDOCAINE HCL (PF) 2 % IJ SOLN
INTRAMUSCULAR | Status: DC | PRN
  Administered 2017-12-26: 80 mg

## 2017-12-26 MED ORDER — FENTANYL CITRATE (PF) 100 MCG/2ML IJ SOLN
INTRAMUSCULAR | Status: AC
  Filled 2017-12-26: qty 2

## 2017-12-26 MED ORDER — FENTANYL CITRATE (PF) 100 MCG/2ML IJ SOLN
INTRAMUSCULAR | Status: DC | PRN
  Administered 2017-12-26 (×4): 25 ug via INTRAVENOUS

## 2017-12-26 MED ORDER — MIDAZOLAM HCL 5 MG/5ML IJ SOLN
INTRAMUSCULAR | Status: DC | PRN
  Administered 2017-12-26: 2 mg via INTRAVENOUS

## 2017-12-26 MED ORDER — GLYCOPYRROLATE 0.2 MG/ML IJ SOLN
INTRAMUSCULAR | Status: DC | PRN
  Administered 2017-12-26: 0.2 mg via INTRAVENOUS

## 2017-12-26 MED ORDER — GLYCOPYRROLATE 0.2 MG/ML IJ SOLN
INTRAMUSCULAR | Status: AC
  Filled 2017-12-26: qty 1

## 2017-12-26 MED ORDER — PROPOFOL 10 MG/ML IV BOLUS
INTRAVENOUS | Status: DC | PRN
  Administered 2017-12-26 (×2): 20 mg via INTRAVENOUS

## 2017-12-26 NOTE — Op Note (Signed)
[facility] Patient Name: [name] Procedure Date: 12/26/2017 11:15 AM MRN: [id number] Account #: [id number] Date of Birth: September 11, 1945 Admit Type: Outpatient Age: 72 Room: [facility] ENDO ROOM 3 Gender: Female Note Status: Finalized Procedure:            Colonoscopy Indications:          High risk colon cancer surveillance: Personal history                        of colonic polyps, Family history of colon cancer in a                        first-degree relative Providers:            Manya Silvas, MD Complications:        No immediate complications. Procedure:            Pre-Anesthesia Assessment:                       - After reviewing the risks and benefits, the patient                        was deemed in satisfactory condition to undergo the                        procedure.                       After obtaining informed consent, the colonoscope was                        passed under direct vision. Throughout the procedure,                        the patient's blood pressure, pulse, and oxygen                        saturations were monitored continuously. The was                        introduced through the anus and advanced to the the                        cecum, identified by appendiceal orifice and ileocecal                        valve. The colonoscopy was performed without                        difficulty. The patient tolerated the procedure well.                        The quality of the bowel preparation was excellent. Findings:      Multiple small and large-mouthed diverticula were found in the sigmoid       colon, descending colon and transverse colon.      Internal hemorrhoids were found during endoscopy. The hemorrhoids were       small and Grade I (internal hemorrhoids that do not prolapse).      The exam was otherwise without abnormality. Impression:           -  Diverticulosis in the sigmoid colon, in the       descending colon and in the transverse colon.                       - Internal hemorrhoids.                       - The examination was otherwise normal.                       - No specimens collected. Recommendation:       - Repeat colonoscopy in 5 years for surveillance. Manya Silvas, MD 12/26/2017 12:04:23 PM This report has been signed electronically. Number of Addenda: 0 Note Initiated On: 12/26/2017 11:15 AM Scope Withdrawal Time: 0 hours 6 minutes 56 seconds  Total Procedure Duration: 0 hours 16 minutes 21 seconds       [facility]

## 2017-12-26 NOTE — Anesthesia Postprocedure Evaluation (Signed)
Anesthesia Post Note  Patient: ZAMARIA BRAZZLE  Procedure(s) Performed: COLONOSCOPY WITH PROPOFOL (N/A ) ESOPHAGOGASTRODUODENOSCOPY (EGD)  Patient location during evaluation: Endoscopy Anesthesia Type: General Level of consciousness: awake and alert Pain management: pain level controlled Vital Signs Assessment: post-procedure vital signs reviewed and stable Respiratory status: spontaneous breathing, nonlabored ventilation, respiratory function stable and patient connected to nasal cannula oxygen Cardiovascular status: blood pressure returned to baseline and stable Postop Assessment: no apparent nausea or vomiting Anesthetic complications: no     Last Vitals:  Vitals:   12/26/17 1224 12/26/17 1234  BP: (!) 121/105 (!) 134/100  Pulse: 66 (!) 51  Resp: 15   Temp:    SpO2: 100% (!) 88%    Last Pain:  Vitals:   12/26/17 1224  TempSrc:   PainSc: 0-No pain                 Martha Clan

## 2017-12-26 NOTE — H&P (Signed)
Primary Care Physician:  Glendon Axe, MD Primary Gastroenterologist:  Dr. Vira Agar  Pre-Procedure History & Physical: HPI:  [name] is a 72 y.o. female is here for an endoscopy and colonoscopy.  Due to [facility] colon polyps and FH colon cancer. And GERD.   Past Medical History:  Diagnosis Date  . Back ache 11/26/2014  . Benign essential HTN 11/26/2014  . Bilateral tinnitus 11/26/2014  . Cancer ([place])    basal cell carcinoma on nose  . DDD (degenerative disc disease), cervical 11/26/2014  . 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 DVT (deep venous thrombosis) ([place])   . GERD (gastroesophageal reflux disease)   . Gout   . H/O malignant neoplasm of skin 06/20/2012  . Hemorrhoids   . History of anemia   . History of kidney stones   . History of pneumonia   . Numbness and tingling   . 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 PONV (postoperative nausea and vomiting)    "hard to come out of anesthesia"  . Renal colic 2/29/7989    Past Surgical History:  Procedure Laterality Date  . BREAST EXCISIONAL BIOPSY Right 2012  . CHOLECYSTECTOMY    . CYSTOSCOPY/RETROGRADE/URETEROSCOPY    . EYE SURGERY Left   . KNEE SURGERY Left   . lower back surgery    . NECK SURGERY     x4  . RETINAL DETACHMENT SURGERY Left   . SKIN CANCER EXCISION     nose  . TONSILLECTOMY    . TUBAL LIGATION    . WISDOM TOOTH EXTRACTION      Prior to Admission medications   Medication Sig Start Date End Date Taking? Authorizing Provider  benazepril (LOTENSIN) 20 MG tablet Take by mouth. 06/16/17 06/16/18 Yes [provider]  gabapentin (NEURONTIN) 400 MG capsule Take 400-800 mg by mouth 3 (three) times daily. 400 in the morning, 400 in the afternoon and 800mg  at bedtime 02/26/15  Yes [provider]  Omega-3 Fatty Acids (FISH OIL OMEGA-3 PO) Take by mouth.   Yes [provider]  Polyethyl Glycol-Propyl Glycol (SYSTANE ULTRA OP) Apply 1 drop to eye as needed (dry eyes).   Yes [provider]  psyllium (METAMUCIL) 58.6 % powder Take 1 packet by mouth 3 (three) times daily.   Yes [provider]  amLODipine-benazepril (LOTREL) 5-10 MG per capsule Take by mouth.    [provider]  Biotin 1 MG CAPS Take 1 mg by mouth daily.    [provider]  Cholecalciferol (VITAMIN D-1000 MAX ST) 1000 UNITS tablet Take 2,000 Units by mouth daily.     [provider]  colchicine 0.6 MG tablet Take 0.6 mg by mouth daily as needed (gout).    [provider]  cyclobenzaprine (FLEXERIL) 10 MG tablet Take 1 tablet (10 mg total) by mouth 3 (three) times daily as needed for muscle spasms. Patient taking differently: Take 10 mg by mouth 2 (two) times daily as needed for muscle spasms.  06/13/15   Newman Pies, MD  docusate sodium (COLACE) 100 MG capsule Take 1 capsule (100 mg total) by mouth 2 (two) times daily. 06/13/15   Newman Pies, MD  famotidine (PEPCID) 10 MG tablet Take 10 mg by mouth as needed for heartburn or indigestion.    [provider]  FIBER PO Take 1 tablet by mouth daily.    [provider]  Flaxseed, Linseed, (FLAXSEED OIL) 1000 MG CAPS Take 1,000 mg by mouth daily.  [provider]  furosemide (LASIX) 20 MG tablet  09/20/17   [provider]  Garlic 8416 MG CAPS Take 1,000 mg by mouth daily.     [provider]  hydrochlorothiazide (HYDRODIURIL) 25 MG tablet Take 12.5 mg by mouth daily.     [provider]  HYDROMET 5-1.5 MG/5ML syrup TK 5 ML PO Q 6 H PRN 07/23/17   [provider]  Multiple Vitamin (MULTI-VITAMINS) TABS Take 1 tablet by mouth daily.     [provider]  Omega-3 1000 MG CAPS Take 1,000 mg by mouth daily.     [provider]  oxyCODONE-acetaminophen (PERCOCET) 10-325 MG tablet Take 1 tablet by mouth every 4 (four) hours as needed for pain. Patient not taking: Reported on 09/06/2017 06/13/15   Newman Pies, MD  psyllium  (METAMUCIL) 58.6 % powder Take 1 packet by mouth daily.    [provider]  triamterene-hydrochlorothiazide (MAXZIDE-25) 37.5-25 MG per tablet Take 1 tablet by mouth daily.  03/24/15   [provider]    Allergies as of 11/07/2017 - Review Complete 10/11/2017  Allergen Reaction Noted  . Iodinated diagnostic agents Anaphylaxis 03/25/2015  . Iodine Anaphylaxis 06/20/2012  . Clarithromycin Other (See Comments) 03/25/2015  . Codeine Nausea Only 03/25/2015    Family History  Problem Relation Age of Onset  . Breast cancer Paternal Aunt   . Breast cancer Cousin     Social History   Socioeconomic History  . Marital status: Married    Spouse name: Not on file  . Number of children: Not on file  . Years of education: Not on file  . Highest education level: Not on file  Occupational History  . Not on file  Social Needs  . Financial resource strain: Not on file  . Food insecurity:    Worry: Not on file    Inability: Not on file  . Transportation needs:    Medical: Not on file    Non-medical: Not on file  Tobacco Use  . Smoking status: Never Smoker  . Smokeless tobacco: Never Used  Substance and Sexual Activity  . Alcohol use: No    Alcohol/week: 0.0 oz  . Drug use: No  . Sexual activity: Not on file  Lifestyle  . Physical activity:    Days per week: Not on file    Minutes per session: Not on file  . Stress: Not on file  Relationships  . Social connections:    Talks on phone: Not on file    Gets together: Not on file    Attends religious service: Not on file    Active member of club or organization: Not on file    Attends meetings of clubs or organizations: Not on file    Relationship status: Not on file  . Intimate partner violence:    Fear of current or ex partner: Not on file    Emotionally abused: Not on file    Physically abused: Not on file    Forced sexual activity: Not on file  Other Topics Concern  . Not on file  Social History Narrative   . Not on file    Review of Systems: See HPI, otherwise negative ROS  Physical Exam: BP 139/85   Pulse 75   Temp 97.6 F (36.4 C) (Tympanic)   Resp 20   Ht 5\' 6"  (1.676 m)   Wt 83.9 kg (185 lb)   SpO2 100%   BMI 29.86 kg/m  General:  Alert,  pleasant and cooperative in NAD Head:  Normocephalic and atraumatic. Neck:  Supple; no masses or thyromegaly. Lungs:  Clear throughout to auscultation.    Heart:  Regular rate and rhythm. Abdomen:  Soft, nontender and nondistended. Normal bowel sounds, without guarding, and without rebound.   Neurologic:  Alert and  oriented x4;  grossly normal neurologically.  Impression/Plan: [name] is here for an endoscopy and colonoscopy to be performed for [facility] colon polyps and FH colon cancer.  Risks, benefits, limitations, and alternatives regarding  endoscopy and colonoscopy have been reviewed with the patient.  Questions have been answered.  All parties agreeable.   Gaylyn Cheers, MD  12/26/2017, 11:18 AM

## 2017-12-26 NOTE — Transfer of Care (Signed)
Immediate Anesthesia Transfer of Care Note  Patient: [name]  Procedure(s) Performed: COLONOSCOPY WITH PROPOFOL (N/A ) ESOPHAGOGASTRODUODENOSCOPY (EGD)  Patient Location: PACU  Anesthesia Type:General  Level of Consciousness: sedated  Airway & Oxygen Therapy: Patient Spontanous Breathing and Patient connected to nasal cannula oxygen  Post-op Assessment: Report given to RN and Post -op Vital signs reviewed and stable  Post vital signs: Reviewed and stable  Last Vitals:  Vitals Value Taken Time  BP 109/77 12/26/2017 12:03 PM  Temp    Pulse 70 12/26/2017 12:03 PM  Resp 22 12/26/2017 12:03 PM  SpO2 100 % 12/26/2017 12:03 PM  Vitals shown include unvalidated device data.  Last Pain:  Vitals:   12/26/17 1024  TempSrc: Tympanic  PainSc: 6          Complications: No apparent anesthesia complications

## 2017-12-26 NOTE — Op Note (Signed)
[facility]astroenterology Patient Name: [name] Procedure Date: 12/26/2017 11:22 AM MRN: [id number] Account #: [id number] Date of Birth: [birth year] Admit Type: Outpatient Age: 72 Room: [facility] ENDO ROOM 3 Gender: Female Note Status: Finalized Procedure:            Upper GI endoscopy Indications:          Heartburn, Suspected gastro-esophageal reflux disease Providers:            Manya Silvas, MD Referring MD:         Glendon Axe (Referring MD) Medicines:            Propofol per Anesthesia Complications:        No immediate complications. Procedure:            Pre-Anesthesia Assessment:                       - After reviewing the risks and benefits, the patient                        was deemed in satisfactory condition to undergo the                        procedure.                       After obtaining informed consent, the endoscope was                        passed under direct vision. Throughout the procedure,                        the patient's blood pressure, pulse, and oxygen                        saturations were monitored continuously. The Endoscope                        was introduced through the mouth, and advanced to the                        second part of duodenum. The upper GI endoscopy was                        accomplished without difficulty. The patient tolerated                        the procedure well. Findings:      A small hiatal hernia was present. Esophagus at 41cm. Esophagus normal      Localized mildly erythematous mucosa without bleeding was found in the       gastric antrum.      The examined duodenum was normal. Impression:           - Small hiatal hernia.                       - Erythematous mucosa in the antrum.                       - Normal examined duodenum.                       -  No specimens collected. Recommendation:       - Perform a colonoscopy as previously scheduled. Manya Silvas, MD 12/26/2017  11:38:57 AM This report has been signed electronically. Number of Addenda: 0 Note Initiated On: 12/26/2017 11:22 AM      [facility]

## 2017-12-26 NOTE — Anesthesia Preprocedure Evaluation (Signed)
Anesthesia Evaluation  Patient identified by MRN, date of birth, ID band Patient awake    Reviewed: Allergy & Precautions, H&P , NPO status , Patient's Chart, lab work & pertinent test results, reviewed documented beta blocker date and time   History of Anesthesia Complications (+) PONV and history of anesthetic complications  Airway Mallampati: III  TM Distance: >3 FB Neck ROM: full    Dental  (+) Dental Advidsory Given, Teeth Intact, Caps   Pulmonary neg pulmonary ROS,           Cardiovascular Exercise Tolerance: Good hypertension, (-) angina+ DVT  (-) CAD, (-) Past MI, (-) Cardiac Stents and (-) CABG (-) dysrhythmias (-) Valvular Problems/Murmurs     Neuro/Psych negative neurological ROS  negative psych ROS   GI/Hepatic Neg liver ROS, GERD  ,  Endo/Other  negative endocrine ROS  Renal/GU Renal disease (kidney stones)  negative genitourinary   Musculoskeletal   Abdominal   Peds  Hematology negative hematology ROS (+)   Anesthesia Other Findings Past Medical History: 11/26/2014: Back ache 11/26/2014: Benign essential HTN 11/26/2014: Bilateral tinnitus No date: Cancer [facility])     Comment:  basal cell carcinoma on nose 11/26/2014: DDD (degenerative disc disease), cervical 04/27/2013: Detachment, retinal, with retinal defect No date: DVT (deep venous thrombosis) ([facility]) No date: GERD (gastroesophageal reflux disease) No date: Gout 06/20/2012: H/O malignant neoplasm of skin No date: Hemorrhoids No date: History of anemia No date: History of kidney stones No date: History of pneumonia No date: Numbness and tingling 11/26/2014: Phlebectasia No date: PONV (postoperative nausea and vomiting)     Comment:  "hard to come out of anesthesia" 8/00/3491: Renal colic   Reproductive/Obstetrics negative OB ROS                             Anesthesia Physical Anesthesia Plan  ASA: II  Anesthesia  Plan: General   Post-op Pain Management:    Induction: Intravenous  PONV Risk Score and Plan: 4 or greater and Propofol infusion  Airway Management Planned: Nasal Cannula  Additional Equipment:   Intra-op Plan:   Post-operative Plan:   Informed Consent: I have reviewed the patients History and Physical, chart, labs and discussed the procedure including the risks, benefits and alternatives for the proposed anesthesia with the patient or authorized representative who has indicated his/her understanding and acceptance.   Dental Advisory Given  Plan Discussed with: Anesthesiologist, CRNA and Surgeon  Anesthesia Plan Comments:         Anesthesia Quick Evaluation

## 2017-12-26 NOTE — Anesthesia Post-op Follow-up Note (Signed)
Anesthesia QCDR form completed.        

## 2017-12-28 ENCOUNTER — Encounter: Payer: Self-pay | Admitting: Unknown Physician Specialty

## 2018-04-27 ENCOUNTER — Other Ambulatory Visit: Payer: Self-pay | Admitting: Neurosurgery

## 2018-04-27 DIAGNOSIS — M545 Low back pain, unspecified: Secondary | ICD-10-CM

## 2018-05-18 ENCOUNTER — Ambulatory Visit
Admission: RE | Admit: 2018-05-18 | Discharge: 2018-05-18 | Disposition: A | Discharge status: Home / Self Care | Payer: Medicare Other | Source: Ambulatory Visit | Attending: Neurosurgery | Admitting: Neurosurgery

## 2018-05-18 DIAGNOSIS — M5137 Other intervertebral disc degeneration, lumbosacral region: Secondary | ICD-10-CM | POA: Insufficient documentation

## 2018-05-18 DIAGNOSIS — M545 Low back pain, unspecified: Secondary | ICD-10-CM

## 2018-05-18 DIAGNOSIS — M5136 Other intervertebral disc degeneration, lumbar region: Secondary | ICD-10-CM | POA: Insufficient documentation

## 2018-05-18 DIAGNOSIS — G8929 Other chronic pain: Secondary | ICD-10-CM | POA: Diagnosis not present

## 2018-05-18 LAB — POCT I-STAT CREATININE: Creatinine, Ser: 0.8 mg/dL (ref 0.44–1.00)

## 2018-05-18 MED ORDER — GADOBUTROL 1 MMOL/ML IV SOLN
8.5000 mL | Freq: Once | INTRAVENOUS | Status: AC | PRN
  Administered 2018-05-18: 8.5 mL via INTRAVENOUS

## 2018-07-16 ENCOUNTER — Emergency Department: Payer: Medicare Other

## 2018-07-16 ENCOUNTER — Inpatient Hospital Stay
Admission: EM | Admit: 2018-07-16 | Discharge: 2018-07-20 | DRG: 281 | Disposition: A | Discharge status: Home / Self Care | Payer: Medicare Other | Attending: Internal Medicine | Admitting: Internal Medicine

## 2018-07-16 ENCOUNTER — Other Ambulatory Visit: Payer: Self-pay

## 2018-07-16 DIAGNOSIS — G8929 Other chronic pain: Secondary | ICD-10-CM | POA: Diagnosis present

## 2018-07-16 DIAGNOSIS — M549 Dorsalgia, unspecified: Secondary | ICD-10-CM | POA: Diagnosis present

## 2018-07-16 DIAGNOSIS — K219 Gastro-esophageal reflux disease without esophagitis: Secondary | ICD-10-CM | POA: Diagnosis present

## 2018-07-16 DIAGNOSIS — I5181 Takotsubo syndrome: Secondary | ICD-10-CM | POA: Diagnosis present

## 2018-07-16 DIAGNOSIS — I959 Hypotension, unspecified: Secondary | ICD-10-CM | POA: Diagnosis not present

## 2018-07-16 DIAGNOSIS — Z85828 Personal history of other malignant neoplasm of skin: Secondary | ICD-10-CM | POA: Diagnosis not present

## 2018-07-16 DIAGNOSIS — Z79899 Other long term (current) drug therapy: Secondary | ICD-10-CM | POA: Diagnosis not present

## 2018-07-16 DIAGNOSIS — Z86718 Personal history of other venous thrombosis and embolism: Secondary | ICD-10-CM | POA: Diagnosis not present

## 2018-07-16 DIAGNOSIS — I4891 Unspecified atrial fibrillation: Secondary | ICD-10-CM | POA: Diagnosis not present

## 2018-07-16 DIAGNOSIS — Z91041 Radiographic dye allergy status: Secondary | ICD-10-CM | POA: Diagnosis not present

## 2018-07-16 DIAGNOSIS — Z803 Family history of malignant neoplasm of breast: Secondary | ICD-10-CM

## 2018-07-16 DIAGNOSIS — I10 Essential (primary) hypertension: Secondary | ICD-10-CM | POA: Diagnosis present

## 2018-07-16 DIAGNOSIS — D72829 Elevated white blood cell count, unspecified: Secondary | ICD-10-CM | POA: Diagnosis not present

## 2018-07-16 DIAGNOSIS — I214 Non-ST elevation (NSTEMI) myocardial infarction: Secondary | ICD-10-CM | POA: Diagnosis present

## 2018-07-16 LAB — POCT I-STAT, CHEM 8
BUN: 18 mg/dL (ref 8–23)
CALCIUM ION: 1.11 mmol/L — AB (ref 1.15–1.40)
Chloride: 102 mmol/L (ref 98–111)
Creatinine, Ser: 0.7 mg/dL (ref 0.44–1.00)
GLUCOSE: 107 mg/dL — AB (ref 70–99)
HCT: 46 % (ref 36.0–46.0)
HEMOGLOBIN: 15.6 g/dL — AB (ref 12.0–15.0)
POTASSIUM: 4.1 mmol/L (ref 3.5–5.1)
SODIUM: 137 mmol/L (ref 135–145)
TCO2: 26 mmol/L (ref 22–32)

## 2018-07-16 LAB — BASIC METABOLIC PANEL
Anion gap: 12 (ref 5–15)
BUN: 18 mg/dL (ref 8–23)
CALCIUM: 9.3 mg/dL (ref 8.9–10.3)
CO2: 22 mmol/L (ref 22–32)
CREATININE: 0.65 mg/dL (ref 0.44–1.00)
Chloride: 102 mmol/L (ref 98–111)
GLUCOSE: 104 mg/dL — AB (ref 70–99)
Potassium: 4.1 mmol/L (ref 3.5–5.1)
Sodium: 136 mmol/L (ref 135–145)

## 2018-07-16 LAB — APTT: APTT: 128 s — AB (ref 24–36)

## 2018-07-16 LAB — GLUCOSE, CAPILLARY: Glucose-Capillary: 112 mg/dL — ABNORMAL HIGH (ref 70–99)

## 2018-07-16 LAB — PROTIME-INR
INR: 1.04
Prothrombin Time: 13.5 seconds (ref 11.4–15.2)

## 2018-07-16 LAB — TROPONIN I
TROPONIN I: 0.2 ng/mL — AB (ref <0.03)
Troponin I: 7.99 ng/mL (ref <0.03)
Troponin I: 6.52 ng/mL (ref <0.03)

## 2018-07-16 LAB — CBC
HCT: 45.5 % (ref 36.0–46.0)
Hemoglobin: 14.2 g/dL (ref 12.0–15.0)
MCH: 30 pg (ref 26.0–34.0)
MCHC: 31.2 g/dL (ref 30.0–36.0)
MCV: 96 fL (ref 80.0–100.0)
PLATELETS: 331 10*3/uL (ref 150–400)
RBC: 4.74 MIL/uL (ref 3.87–5.11)
RDW: 13 % (ref 11.5–15.5)
WBC: 10.7 10*3/uL — ABNORMAL HIGH (ref 4.0–10.5)
nRBC: 0 % (ref 0.0–0.2)

## 2018-07-16 LAB — PHOSPHORUS: PHOSPHORUS: 3.6 mg/dL (ref 2.5–4.6)

## 2018-07-16 LAB — HEPARIN LEVEL (UNFRACTIONATED): HEPARIN UNFRACTIONATED: 0.36 [IU]/mL (ref 0.30–0.70)

## 2018-07-16 LAB — MRSA PCR SCREENING: MRSA by PCR: NEGATIVE

## 2018-07-16 MED ORDER — FAMOTIDINE 20 MG PO TABS: 10.0000 mg | ORAL_TABLET | ORAL | Status: DC | PRN

## 2018-07-16 MED ORDER — NITROGLYCERIN IN D5W 200-5 MCG/ML-% IV SOLN
0.0000 ug/min | INTRAVENOUS | Status: DC
  Administered 2018-07-16: 5 ug/min via INTRAVENOUS
  Filled 2018-07-16: qty 250

## 2018-07-16 MED ORDER — GABAPENTIN 400 MG PO CAPS
400.0000 mg | ORAL_CAPSULE | Freq: Two times a day (BID) | ORAL | Status: DC
  Administered 2018-07-16 – 2018-07-20 (×7): 400 mg via ORAL
  Filled 2018-07-16 (×7): qty 1

## 2018-07-16 MED ORDER — GABAPENTIN 400 MG PO CAPS
800.0000 mg | ORAL_CAPSULE | Freq: Every day | ORAL | Status: DC
Start: 2018-07-16 — End: 2018-07-20
  Administered 2018-07-16 – 2018-07-19 (×4): 800 mg via ORAL
  Filled 2018-07-16 (×4): qty 2

## 2018-07-16 MED ORDER — CYCLOBENZAPRINE HCL 10 MG PO TABS
10.0000 mg | ORAL_TABLET | Freq: Three times a day (TID) | ORAL | Status: DC | PRN
  Filled 2018-07-16: qty 1

## 2018-07-16 MED ORDER — ASPIRIN EC 81 MG PO TBEC
81.0000 mg | DELAYED_RELEASE_TABLET | Freq: Every day | ORAL | Status: DC
Start: 2018-07-17 — End: 2018-07-20
  Administered 2018-07-17 – 2018-07-20 (×4): 81 mg via ORAL
  Filled 2018-07-16 (×4): qty 1

## 2018-07-16 MED ORDER — PSYLLIUM 95 % PO PACK
1.0000 | PACK | Freq: Every day | ORAL | Status: DC
  Administered 2018-07-16 – 2018-07-20 (×4): 1 via ORAL
  Filled 2018-07-16 (×5): qty 1

## 2018-07-16 MED ORDER — DOCUSATE SODIUM 100 MG PO CAPS
100.0000 mg | ORAL_CAPSULE | Freq: Two times a day (BID) | ORAL | Status: DC
  Administered 2018-07-16 (×2): 100 mg via ORAL

## 2018-07-16 MED ORDER — GABAPENTIN 400 MG PO CAPS: 400.0000 mg | ORAL_CAPSULE | Freq: Three times a day (TID) | ORAL | Status: DC

## 2018-07-16 MED ORDER — DIPHENHYDRAMINE HCL 25 MG PO CAPS
25.0000 mg | ORAL_CAPSULE | Freq: Once | ORAL | Status: AC
  Administered 2018-07-16: 25 mg via ORAL
  Filled 2018-07-16: qty 1

## 2018-07-16 MED ORDER — POLYETHYLENE GLYCOL 3350 17 G PO PACK
17.0000 g | PACK | Freq: Every day | ORAL | Status: DC | PRN
  Administered 2018-07-18: 17 g via ORAL
  Filled 2018-07-16 (×2): qty 1

## 2018-07-16 MED ORDER — ACETAMINOPHEN 650 MG RE SUPP: 650.0000 mg | Freq: Four times a day (QID) | RECTAL | Status: DC | PRN

## 2018-07-16 MED ORDER — HEPARIN (PORCINE) 25000 UT/250ML-% IV SOLN
1050.0000 [IU]/h | INTRAVENOUS | Status: DC
  Administered 2018-07-16: 950 [IU]/h via INTRAVENOUS
  Filled 2018-07-16 (×2): qty 250

## 2018-07-16 MED ORDER — HEPARIN BOLUS VIA INFUSION
4000.0000 [IU] | Freq: Once | INTRAVENOUS | Status: AC
  Administered 2018-07-16: 4000 [IU] via INTRAVENOUS
  Filled 2018-07-16: qty 4000

## 2018-07-16 MED ORDER — FENTANYL CITRATE (PF) 100 MCG/2ML IJ SOLN
50.0000 ug | Freq: Once | INTRAMUSCULAR | Status: AC
  Administered 2018-07-16: 50 ug via INTRAVENOUS
  Filled 2018-07-16: qty 2

## 2018-07-16 MED ORDER — SODIUM CHLORIDE 0.9% FLUSH
3.0000 mL | Freq: Two times a day (BID) | INTRAVENOUS | Status: DC
  Administered 2018-07-16 – 2018-07-18 (×5): 3 mL via INTRAVENOUS

## 2018-07-16 MED ORDER — LIDOCAINE VISCOUS HCL 2 % MT SOLN
15.0000 mL | Freq: Once | OROMUCOSAL | Status: AC
Start: 2018-07-16 — End: 2018-07-16
  Administered 2018-07-16: 15 mL via ORAL
  Filled 2018-07-16: qty 15

## 2018-07-16 MED ORDER — ONDANSETRON HCL 4 MG/2ML IJ SOLN
4.0000 mg | Freq: Once | INTRAMUSCULAR | Status: AC
  Administered 2018-07-16: 4 mg via INTRAVENOUS
  Filled 2018-07-16: qty 2

## 2018-07-16 MED ORDER — ONDANSETRON HCL 4 MG/2ML IJ SOLN: 4.0000 mg | Freq: Four times a day (QID) | INTRAMUSCULAR | Status: DC | PRN

## 2018-07-16 MED ORDER — ONDANSETRON HCL 4 MG PO TABS: 4.0000 mg | ORAL_TABLET | Freq: Four times a day (QID) | ORAL | Status: DC | PRN

## 2018-07-16 MED ORDER — ALUM & MAG HYDROXIDE-SIMETH 200-200-20 MG/5ML PO SUSP
30.0000 mL | Freq: Once | ORAL | Status: AC
  Administered 2018-07-16: 30 mL via ORAL
  Filled 2018-07-16: qty 30

## 2018-07-16 MED ORDER — ALBUTEROL SULFATE (2.5 MG/3ML) 0.083% IN NEBU: 2.5000 mg | INHALATION_SOLUTION | RESPIRATORY_TRACT | Status: DC | PRN

## 2018-07-16 MED ORDER — ASPIRIN 81 MG PO CHEW
324.0000 mg | CHEWABLE_TABLET | Freq: Once | ORAL | Status: AC
  Administered 2018-07-16: 324 mg via ORAL
  Filled 2018-07-16: qty 4

## 2018-07-16 MED ORDER — DOCUSATE SODIUM 100 MG PO CAPS
100.0000 mg | ORAL_CAPSULE | Freq: Two times a day (BID) | ORAL | Status: DC
  Administered 2018-07-16 – 2018-07-20 (×8): 100 mg via ORAL
  Filled 2018-07-16 (×9): qty 1

## 2018-07-16 MED ORDER — PREDNISONE 10 MG PO TABS
60.0000 mg | ORAL_TABLET | Freq: Once | ORAL | Status: AC
  Administered 2018-07-16: 60 mg via ORAL
  Filled 2018-07-16: qty 6

## 2018-07-16 MED ORDER — ACETAMINOPHEN 325 MG PO TABS
650.0000 mg | ORAL_TABLET | Freq: Four times a day (QID) | ORAL | Status: DC | PRN
  Administered 2018-07-18 – 2018-07-19 (×2): 325 mg via ORAL
  Filled 2018-07-16 (×3): qty 2

## 2018-07-16 NOTE — H&P (Signed)
[place] [facility] [place] NAME: [name]    MR#:  [id number]  DATE OF BIRTH:  [birth year]  DATE OF ADMISSION:  07/16/2018  PRIMARY CARE PHYSICIAN: Glendon Axe, MD   REQUESTING/REFERRING PHYSICIAN: Dr. Kerman Passey  CHIEF COMPLAINT:   Chief Complaint  Patient presents with  . Chest Pain    HISTORY OF PRESENT ILLNESS:  [name]  is a 73 y.o. female with a known history of hypertension, GERD, hiatal hernia, DVT presented to the emergency room complaining of acute onset of chest pain radiating to the back.  Patient has chronic back pain which is unchanged.  Also had shortness of breath and felt fatigued.  No palpitations.  Afebrile.  Here patient's EKG showed some ST-T wave changes.  Troponin was elevated at 0.2.  Cardiology was called.  Dr. Henrene Pastor shows has seen the patient.  Diagnosed with non-ST elevation MI.  Patient will be started on heparin and nitro drip and will be admitted to stepdown unit.  PAST MEDICAL HISTORY:   Past Medical History:  Diagnosis Date  . Back ache 11/26/2014  . Benign essential HTN 11/26/2014  . Bilateral tinnitus 11/26/2014  . Cancer ([place])    basal cell carcinoma on nose  . DDD (degenerative disc disease), cervical 11/26/2014  . 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 DVT (deep venous thrombosis) ([place])   . GERD (gastroesophageal reflux disease)   . Gout   . H/O malignant neoplasm of skin 06/20/2012  . Hemorrhoids   . History of anemia   . History of kidney stones   . History of pneumonia   . Numbness and tingling   . 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 PONV (postoperative nausea and vomiting)    "hard to come out of anesthesia"  . Renal colic 1/60/7371    PAST SURGICAL HISTORY:   Past Surgical History:  Procedure Laterality Date  . BREAST EXCISIONAL BIOPSY Right 2012  . CHOLECYSTECTOMY    . COLONOSCOPY WITH PROPOFOL N/A 12/26/2017   Procedure: COLONOSCOPY WITH PROPOFOL;  Surgeon: Manya Silvas, MD;  Location[facility] END[facility]COPY;  Service: Endoscopy;  Laterality: N/A;  . CYSTOSCOPY/RETROGRADE/URETEROSCOPY    . ESOPHAGOGASTRODUODENOSCOPY  12/26/2017   Procedure: ESOPHAGOGASTRODUODENOSCOPY (EGD);  Surgeon: Manya Silvas, MD;  Location[facility] END[facility]COPY;  Service: Endoscopy;;  . EYE SURGERY Left   . KNEE SURGERY Left   . lower back surgery    . NECK SURGERY     x4  . RETINAL DETACHMENT SURGERY Left   . SKIN CANCER EXCISION     nose  . TONSILLECTOMY    . TUBAL LIGATION    . WISDOM TOOTH EXTRACTION      SOCIAL HISTORY:   Social History   Tobacco Use  . Smoking status: Never Smoker  . Smokeless tobacco: Never Used  Substance Use Topics  . Alcohol use: No    Alcohol/week: 0.0 standard drinks    FAMILY HISTORY:   Family History  Problem Relation Age of Onset  . Breast cancer Paternal Aunt   . Breast cancer Cousin     DRUG ALLERGIES:   Allergies  Allergen Reactions  . Bee Venom Anaphylaxis  . Iodinated Diagnostic Agents Anaphylaxis  . Iodine Anaphylaxis  . Clarithromycin Other (See Comments)    Generalized weakness Heart feels funny  . Codeine Nausea Only    REVIEW OF SYSTEMS:   Review of Systems  Constitutional: Negative for chills and fever.  HENT: Negative for sore throat.  Eyes: Negative for blurred vision, double vision and pain.  Respiratory: Negative for cough, hemoptysis, shortness of breath and wheezing.   Cardiovascular: Negative for chest pain, palpitations, orthopnea and leg swelling.  Gastrointestinal: Negative for abdominal pain, constipation, diarrhea, heartburn, nausea and vomiting.  Genitourinary: Negative for dysuria and hematuria.  Musculoskeletal: Negative for back pain and joint pain.  Skin: Negative for rash.  Neurological: Negative for sensory change, speech change, focal weakness and headaches.  Endo/Heme/Allergies: Does not bruise/bleed easily.  Psychiatric/Behavioral: Negative for depression. The patient is not  nervous/anxious.    MEDICATIONS AT HOME:   Prior to Admission medications   Medication Sig Start Date End Date Taking? Authorizing Provider  amLODipine-benazepril (LOTREL) 5-10 MG per capsule Take by mouth.    [provider]  Biotin 1 MG CAPS Take 1 mg by mouth daily.    [provider]  Cholecalciferol (VITAMIN D-1000 MAX ST) 1000 UNITS tablet Take 2,000 Units by mouth daily.     [provider]  colchicine 0.6 MG tablet Take 0.6 mg by mouth daily as needed (gout).    [provider]  cyclobenzaprine (FLEXERIL) 10 MG tablet Take 1 tablet (10 mg total) by mouth 3 (three) times daily as needed for muscle spasms. Patient taking differently: Take 10 mg by mouth 2 (two) times daily as needed for muscle spasms.  06/13/15   Newman Pies, MD  docusate sodium (COLACE) 100 MG capsule Take 1 capsule (100 mg total) by mouth 2 (two) times daily. 06/13/15   Newman Pies, MD  famotidine (PEPCID) 10 MG tablet Take 10 mg by mouth as needed for heartburn or indigestion.    [provider]  FIBER PO Take 1 tablet by mouth daily.    [provider]  Flaxseed, Linseed, (FLAXSEED OIL) 1000 MG CAPS Take 1,000 mg by mouth daily.     [provider]  furosemide (LASIX) 20 MG tablet  09/20/17   [provider]  gabapentin (NEURONTIN) 400 MG capsule Take 400-800 mg by mouth 3 (three) times daily. 400 in the morning, 400 in the afternoon and 800mg  at bedtime 02/26/15   [provider]  Garlic 4098 MG CAPS Take 1,000 mg by mouth daily.     [provider]  hydrochlorothiazide (HYDRODIURIL) 25 MG tablet Take 12.5 mg by mouth daily.     [provider]  HYDROMET 5-1.5 MG/5ML syrup TK 5 ML PO Q 6 H PRN 07/23/17   [provider]  Multiple Vitamin (MULTI-VITAMINS) TABS Take 1 tablet by mouth daily.     [provider]  Omega-3 1000 MG CAPS Take 1,000 mg by mouth daily.     [provider]  Omega-3  Fatty Acids (FISH OIL OMEGA-3 PO) Take by mouth.    [provider]  oxyCODONE-acetaminophen (PERCOCET) 10-325 MG tablet Take 1 tablet by mouth every 4 (four) hours as needed for pain. Patient not taking: Reported on 09/06/2017 06/13/15   Newman Pies, MD  Polyethyl Glycol-Propyl Glycol (SYSTANE ULTRA OP) Apply 1 drop to eye as needed (dry eyes).    [provider]  psyllium (METAMUCIL) 58.6 % powder Take 1 packet by mouth daily.    [provider]  psyllium (METAMUCIL) 58.6 % powder Take 1 packet by mouth 3 (three) times daily.    [provider]  triamterene-hydrochlorothiazide (MAXZIDE-25) 37.5-25 MG per tablet Take 1 tablet by mouth daily.  03/24/15   [provider]     VITAL SIGNS:  Blood pressure Marland Kitchen)  113/93, pulse 99, temperature (!) 97.5 F (36.4 C), temperature source Oral, resp. rate 20, height 5\' 7"  (1.702 m), weight 90 kg, SpO2 100 %.  PHYSICAL EXAMINATION:  Physical Exam  GENERAL:  73 y.o.-year-old patient lying in the bed with no acute distress.  EYES: Pupils equal, round, reactive to light and accommodation. No scleral icterus. Extraocular muscles intact.  HEENT: Head atraumatic, normocephalic. Oropharynx and nasopharynx clear. No oropharyngeal erythema, moist oral mucosa  NECK:  Supple, no jugular venous distention. No thyroid enlargement, no tenderness.  LUNGS: Normal breath sounds bilaterally, no wheezing, rales, rhonchi. No use of accessory muscles of respiration.  CARDIOVASCULAR: S1, S2 normal. No murmurs, rubs, or gallops.  ABDOMEN: Soft, nontender, nondistended. Bowel sounds present. No organomegaly or mass.  EXTREMITIES: No pedal edema, cyanosis, or clubbing. + 2 pedal & radial pulses b/l.   NEUROLOGIC: Cranial nerves II through XII are intact. No focal Motor or sensory deficits appreciated b/l PSYCHIATRIC: The patient is alert and oriented x 3. Good affect.  SKIN: No obvious rash, lesion, or ulcer.   LABORATORY PANEL:    CBC Recent Labs  Lab 07/16/18 1146 07/16/18 1154  WBC 10.7*  --   HGB 14.2 15.6*  HCT 45.5 46.0  PLT 331  --    ------------------------------------------------------------------------------------------------------------------  Chemistries  Recent Labs  Lab 07/16/18 1146 07/16/18 1154  NA 136 137  K 4.1 4.1  CL 102 102  CO2 22  --   GLUCOSE 104* 107*  BUN 18 18  CREATININE 0.65 0.70  CALCIUM 9.3  --    ------------------------------------------------------------------------------------------------------------------  Cardiac Enzymes Recent Labs  Lab 07/16/18 1146  TROPONINI 0.20*   ------------------------------------------------------------------------------------------------------------------  RADIOLOGY:  Dg Chest Portable 1 View  Result Date: 07/16/2018 CLINICAL DATA:  Chest pain EXAM: PORTABLE CHEST 1 VIEW COMPARISON:  09/04/2010 FINDINGS: The heart size and mediastinal contours are within normal limits. Both lungs are clear. The visualized skeletal structures are unremarkable. IMPRESSION: No active disease. Electronically Signed   By: Kerby Moors M.D.   On: 07/16/2018 12:06     IMPRESSION AND PLAN:   *Non-ST elevation MI.  Start aspirin.  Heparin and nitro drip.  Seen by cardiology.  Cardiac catheterization planned for tomorrow.  Patient has allergy to contrast.  Will give 1 dose of prednisone and Benadryl as requested by Dr. Glynis Smiles.  Ordered for tonight.  Dosing after that to be ordered in the morning by cardiology. Ordered echocardiogram.  Admit to stepdown unit Add metoprolol  *Hypertension.  Blood pressure well controlled.   *Chronic back pain.  Continue Neurontin from home  DVT prophylaxis.  On heparin.  All the records are reviewed and case discussed with ED provider. Management plans discussed with the patient, family and they are in agreement.  CODE STATUS: FULL CODE  TOTAL TIME TAKING CARE OF THIS PATIENT: 40 minutes.   Leia Alf  Kento Gossman M.D on 07/16/2018 at 1:17 PM  Between 7am to 6pm - Pager - [phone]  After 6pm go to [email or web address] - password [email or web address] [facility]  Office  [phone]  CC: Primary care physician; Glendon Axe, MD  Note: This dictation was prepared with Dragon dictation along with smaller phrase technology. Any transcriptional errors that result from this process are unintentional.

## 2018-07-16 NOTE — Progress Notes (Signed)
Advance care planning  Purpose of Encounter NSTEMI  Parties in Attendance Patient and HCPOA- Husband at bedside  Patients Decisional capacity Alert and oriented. Able to make medical desicions  Discussed with patient regarding NSTEMI, prognosis and treatment plan.  CODE STATUS discussed.  Patient would like full scope of treatment with intubation and CPR if needed.  Orders entered for full code  Time spent - 17 minutes

## 2018-07-16 NOTE — ED Triage Notes (Signed)
Pt states L CP that began while sitting at church that radiates straight into back. States fell in September and injured back. Has rods in back. States back has been so painful. Pt appears SOB. Is stating that she can't take a deep breath. Appears in pain. Talking in complete sentences. A&O. In wheelchair. Husband with pt.

## 2018-07-16 NOTE — Consult Note (Signed)
[facility] Cardiology  CARDIOLOGY CONSULT NOTE  Patient ID: [name] MRN: [id number] DOB/AGE: 04-01-1946 73 y.o.  Admit date: 07/16/2018 Referring Physician [place] Primary Physician Candiss Norse Primary Cardiologist  Reason for Consultation chest pain  HPI: 73 year old female referred for evaluation of chest pain.  The patient was at church on the morning of admission.  She experienced substernal chest discomfort with radiation to her back.  She describes the pain as pressure-like in sensation, without nausea, vomiting or diaphoresis.  The patient also reports chronic back pain.  ECG revealed sinus rhythm, with right bundle branch block which appears to be new, with associated ST-T wave abnormalities without diagnostic ST elevation consistent with ST elevation myocardial infarction.  Initial troponin was 0.20.  The patient was started on glycerin drip, with improvement of chest pain.  Review of systems complete and found to be negative unless listed above     Past Medical History:  Diagnosis Date  . Back ache 11/26/2014  . Benign essential HTN 11/26/2014  . Bilateral tinnitus 11/26/2014  . Cancer ([place])    basal cell carcinoma on nose  . DDD (degenerative disc disease), cervical 11/26/2014  . 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 DVT (deep venous thrombosis) ([place])   . GERD (gastroesophageal reflux disease)   . Gout   . H/O malignant neoplasm of skin 06/20/2012  . Hemorrhoids   . History of anemia   . History of kidney stones   . History of pneumonia   . Numbness and tingling   . 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 PONV (postoperative nausea and vomiting)    "hard to come out of anesthesia"  . Renal colic 04/03/3006    Past Surgical History:  Procedure Laterality Date  . BREAST EXCISIONAL BIOPSY Right 2012  . CHOLECYSTECTOMY    . COLONOSCOPY WITH PROPOFOL N/A 12/26/2017   Procedure: COLONOSCOPY WITH PROPOFOL;  Surgeon: Manya Silvas, MD;  Location: [facility] ENDOSCOPY;  Service:  Endoscopy;  Laterality: N/A;  . CYSTOSCOPY/RETROGRADE/URETEROSCOPY    . ESOPHAGOGASTRODUODENOSCOPY  12/26/2017   Procedure: ESOPHAGOGASTRODUODENOSCOPY (EGD);  Surgeon: Manya Silvas, MD;  Location: [facility] ENDOSCOPY;  Service: Endoscopy;;  . EYE SURGERY Left   . KNEE SURGERY Left   . lower back surgery    . NECK SURGERY     x4  . RETINAL DETACHMENT SURGERY Left   . SKIN CANCER EXCISION     nose  . TONSILLECTOMY    . TUBAL LIGATION    . WISDOM TOOTH EXTRACTION      (Not in a hospital admission)  Social History   Socioeconomic History  . Marital status: Married    Spouse name: Not on file  . Number of children: Not on file  . Years of education: Not on file  . Highest education level: Not on file  Occupational History  . Not on file  Social Needs  . Financial resource strain: Not on file  . Food insecurity:    Worry: Not on file    Inability: Not on file  . Transportation needs:    Medical: Not on file    Non-medical: Not on file  Tobacco Use  . Smoking status: Never Smoker  . Smokeless tobacco: Never Used  Substance and Sexual Activity  . Alcohol use: No    Alcohol/week: 0.0 standard drinks  . Drug use: No  . Sexual activity: Not on file  Lifestyle  . Physical activity:    Days per week: Not on file    Minutes per  session: Not on file  . Stress: Not on file  Relationships  . Social connections:    Talks on phone: Not on file    Gets together: Not on file    Attends religious service: Not on file    Active member of club or organization: Not on file    Attends meetings of clubs or organizations: Not on file    Relationship status: Not on file  . Intimate partner violence:    Fear of current or ex partner: Not on file    Emotionally abused: Not on file    Physically abused: Not on file    Forced sexual activity: Not on file  Other Topics Concern  . Not on file  Social History Narrative  . Not on file    Family History  Problem Relation Age of Onset  .  Breast cancer Paternal Aunt   . Breast cancer Cousin       Review of systems complete and found to be negative unless listed above      PHYSICAL EXAM  General: Well developed, well nourished, in no acute distress HEENT:  Normocephalic and atramatic Neck:  No JVD.  Lungs: Clear bilaterally to auscultation and percussion. Heart: HRRR . Normal S1 and S2 without gallops or murmurs.  Abdomen: Bowel sounds are positive, abdomen soft and non-tender  Msk:  Back normal, normal gait. Normal strength and tone for age. Extremities: No clubbing, cyanosis or edema.   Neuro: Alert and oriented X 3. Psych:  Good affect, responds appropriately  Labs:   Lab Results  Component Value Date   WBC 10.7 (H) 07/16/2018   HGB 15.6 (H) 07/16/2018   HCT 46.0 07/16/2018   MCV 96.0 07/16/2018   PLT 331 07/16/2018    Recent Labs  Lab 07/16/18 1146 07/16/18 1154  NA 136 137  K 4.1 4.1  CL 102 102  CO2 22  --   BUN 18 18  CREATININE 0.65 0.70  CALCIUM 9.3  --   GLUCOSE 104* 107*   Lab Results  Component Value Date   TROPONINI 0.20 ([place]) 07/16/2018   No results found for: CHOL No results found for: HDL No results found for: LDLCALC No results found for: TRIG No results found for: CHOLHDL No results found for: LDLDIRECT    Radiology: Dg Chest Portable 1 View  Result Date: 07/16/2018 CLINICAL DATA:  Chest pain EXAM: PORTABLE CHEST 1 VIEW COMPARISON:  09/04/2010 FINDINGS: The heart size and mediastinal contours are within normal limits. Both lungs are clear. The visualized skeletal structures are unremarkable. IMPRESSION: No active disease. Electronically Signed   By: Kerby Moors M.D.   On: 07/16/2018 12:06    EKG: Sinus rhythm, right bundle branch block with associated ST-T wave abnormalities  ASSESSMENT AND PLAN:   1.  New onset chest pain, with typical and atypical features, with abnormal ECG, not diagnostic of ST elevation myocardial infarction, with borderline elevated troponin.   Chest pain improved on nitroglycerin drip. 2.  Contrast dye allergy  Recommendations  1.  Agree with current therapy 2.  Add metoprolol tartrate 25 mg twice daily 3.  Cardiac catheterization with selective coronary arteriography in the a.m.  The risks, benefits and alternatives of cardiac catheterization and possible PCI were explained to the patient and informed written consent was obtained. 4.  Premedication for contrast dye allergy  Signed: Isaias Cowman MD,PhD, [facility] 07/16/2018, 1:19 PM

## 2018-07-16 NOTE — ED Notes (Signed)
Pt EKG is showing MI but is not STEMI. EDP at bedside now.

## 2018-07-16 NOTE — ED Provider Notes (Signed)
[facility] Emergency Department Provider Note  Time seen: 11:55 AM  I have reviewed the triage vital signs and the nursing notes.   HISTORY  Chief Complaint Chest Pain    HPI [name] is a 73 y.o. female with a past medical history of chronic back pain, gastric reflux, presents to the emergency department for chest pain and back pain.  According to the patient this morning at church she had acute onset of chest pain radiating to her back.  She states a history of chronic back pain in fact has been trying to get in with her surgeon due to increased back pain recently.  Has an appointment Tuesday with the surgeon.  However she states the pain today started in the front of her chest and goes to her back.  States it feels different than her typical back pain.  States very rarely will she get chest pain except with gastric reflux, patient states she has not been able to burp today and feels like if she could burp it would improve the pain.  Denies any nausea or diaphoresis. does state shortness of breath.   Past Medical History:  Diagnosis Date  . Back ache 11/26/2014  . Benign essential HTN 11/26/2014  . Bilateral tinnitus 11/26/2014  . Cancer ([place])    basal cell carcinoma on nose  . DDD (degenerative disc disease), cervical 11/26/2014  . 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 DVT (deep venous thrombosis) ([place])   . GERD (gastroesophageal reflux disease)   . Gout   . H/O malignant neoplasm of skin 06/20/2012  . Hemorrhoids   . History of anemia   . History of kidney stones   . History of pneumonia   . Numbness and tingling   . 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 PONV (postoperative nausea and vomiting)    "hard to come out of anesthesia"  . Renal colic 4/62/7035    Patient Active Problem List   Diagnosis Date Noted  . Varicose veins of leg with pain, right 09/06/2017  . Lumbar stenosis with neurogenic claudication 06/11/2015  . Renal colic  00/93/8182  . Back ache 11/26/2014  . Benign essential HTN 11/26/2014  . DDD (degenerative disc disease), cervical 11/26/2014  . DDD (degenerative disc disease), lumbosacral 11/26/2014  . 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 Bilateral tinnitus 11/26/2014  . 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 H/O malignant neoplasm of skin 06/20/2012    Past Surgical History:  Procedure Laterality Date  . BREAST EXCISIONAL BIOPSY Right 2012  . CHOLECYSTECTOMY    . COLONOSCOPY WITH PROPOFOL N/A 12/26/2017   Procedure: COLONOSCOPY WITH PROPOFOL;  Surgeon: Manya Silvas, MD;  Location: [facility] ENDOSCOPY;  Service: Endoscopy;  Laterality: N/A;  . CYSTOSCOPY/RETROGRADE/URETEROSCOPY    . ESOPHAGOGASTRODUODENOSCOPY  12/26/2017   Procedure: ESOPHAGOGASTRODUODENOSCOPY (EGD);  Surgeon: Manya Silvas, MD;  Location: [facility] ENDOSCOPY;  Service: Endoscopy;;  . EYE SURGERY Left   . KNEE SURGERY Left   . lower back surgery    . NECK SURGERY     x4  . RETINAL DETACHMENT SURGERY Left   . SKIN CANCER EXCISION     nose  . TONSILLECTOMY    . TUBAL LIGATION    . WISDOM TOOTH EXTRACTION      Prior to Admission medications   Medication Sig Start Date End Date Taking? Authorizing Provider  amLODipine-benazepril (LOTREL) 5-10 MG per capsule Take by mouth.    [provider]  Biotin 1 MG CAPS Take 1  mg by mouth daily.    [provider]  Cholecalciferol (VITAMIN D-1000 MAX ST) 1000 UNITS tablet Take 2,000 Units by mouth daily.     [provider]  colchicine 0.6 MG tablet Take 0.6 mg by mouth daily as needed (gout).    [provider]  cyclobenzaprine (FLEXERIL) 10 MG tablet Take 1 tablet (10 mg total) by mouth 3 (three) times daily as needed for muscle spasms. Patient taking differently: Take 10 mg by mouth 2 (two) times daily as needed for muscle spasms.  06/13/15   Newman Pies, MD  docusate sodium (COLACE) 100 MG capsule Take 1 capsule (100 mg total) by mouth 2  (two) times daily. 06/13/15   Newman Pies, MD  famotidine (PEPCID) 10 MG tablet Take 10 mg by mouth as needed for heartburn or indigestion.    [provider]  FIBER PO Take 1 tablet by mouth daily.    [provider]  Flaxseed, Linseed, (FLAXSEED OIL) 1000 MG CAPS Take 1,000 mg by mouth daily.     [provider]  furosemide (LASIX) 20 MG tablet  09/20/17   [provider]  gabapentin (NEURONTIN) 400 MG capsule Take 400-800 mg by mouth 3 (three) times daily. 400 in the morning, 400 in the afternoon and 800mg  at bedtime 02/26/15   [provider]  Garlic 6948 MG CAPS Take 1,000 mg by mouth daily.     [provider]  hydrochlorothiazide (HYDRODIURIL) 25 MG tablet Take 12.5 mg by mouth daily.     [provider]  HYDROMET 5-1.5 MG/5ML syrup TK 5 ML PO Q 6 H PRN 07/23/17   [provider]  Multiple Vitamin (MULTI-VITAMINS) TABS Take 1 tablet by mouth daily.     [provider]  Omega-3 1000 MG CAPS Take 1,000 mg by mouth daily.     [provider]  Omega-3 Fatty Acids (FISH OIL OMEGA-3 PO) Take by mouth.    [provider]  oxyCODONE-acetaminophen (PERCOCET) 10-325 MG tablet Take 1 tablet by mouth every 4 (four) hours as needed for pain. Patient not taking: Reported on 09/06/2017 06/13/15   Newman Pies, MD  Polyethyl Glycol-Propyl Glycol (SYSTANE ULTRA OP) Apply 1 drop to eye as needed (dry eyes).    [provider]  psyllium (METAMUCIL) 58.6 % powder Take 1 packet by mouth daily.    [provider]  psyllium (METAMUCIL) 58.6 % powder Take 1 packet by mouth 3 (three) times daily.    [provider]  triamterene-hydrochlorothiazide (MAXZIDE-25) 37.5-25 MG per tablet Take 1 tablet by mouth daily.  03/24/15   [provider]    Allergies  Allergen Reactions  . Bee Venom Anaphylaxis  . Iodinated Diagnostic Agents Anaphylaxis  . Iodine Anaphylaxis  .  Clarithromycin Other (See Comments)    Generalized weakness Heart feels funny  . Codeine Nausea Only    Family History  Problem Relation Age of Onset  . Breast cancer Paternal Aunt   . Breast cancer Cousin     Social History Social History   Tobacco Use  . Smoking status: Never Smoker  . Smokeless tobacco: Never Used  Substance Use Topics  . Alcohol use: No    Alcohol/week: 0.0 standard drinks  . Drug use: No    Review of Systems Constitutional: Negative for fever. Cardiovascular: Positive for chest pain Respiratory: Positive for shortness of breath Gastrointestinal: Negative for abdominal pain, vomiting Musculoskeletal: Positive for back pain Skin: Negative for skin complaints  Neurological:  Negative for headache All other ROS negative  ____________________________________________   PHYSICAL EXAM:  VITAL SIGNS: ED Triage Vitals  Enc Vitals Group     BP 07/16/18 1146 (!) 113/93     Pulse Rate 07/16/18 1146 99     Resp 07/16/18 1146 20     Temp 07/16/18 1146 (!) 97.5 F (36.4 C)     Temp Source 07/16/18 1146 Oral     SpO2 07/16/18 1146 100 %     Weight 07/16/18 1146 198 lb 6.6 oz (90 kg)     Height 07/16/18 1146 5\' 7"  (1.702 m)     Head Circumference --      Peak Flow --      Pain Score 07/16/18 1143 10     Pain Loc --      Pain Edu? --      Excl. in [place]? --    Constitutional: Alert and oriented. Well appearing and in no distress. Eyes: Normal exam ENT   Head: Normocephalic and atraumatic   Mouth/Throat: Mucous membranes are moist. Cardiovascular: Normal rate, regular rhythm.  Respiratory: Normal respiratory effort without tachypnea nor retractions. Breath sounds are clear  Gastrointestinal: Soft and nontender. No distention. Musculoskeletal: Nontender with normal range of motion in all extremities. No lower extremity tenderness Neurologic:  Normal speech and language. No gross focal neurologic deficits Skin:  Skin is warm, dry and intact.   Psychiatric: Mood and affect are normal.  ____________________________________________    EKG  EKG viewed and interpreted by myself shows sinus tachycardia at 98 bpm with a widened QRS, left axis deviation nonspecific ST changes.  Computer is reading acute MI, did not see significant ST elevation in inferior leads at this time.  There is a gross change from her last EKG however this was 4 years ago no obvious reciprocal changes.  ____________________________________________    RADIOLOGY  Chest x-ray negative  ____________________________________________   INITIAL IMPRESSION / ASSESSMENT AND PLAN / ED COURSE  Pertinent labs & imaging results that were available during my care of the patient were reviewed by me and considered in my medical decision making (see chart for details).  Patient presents to the emergency department for acute onset of chest pain radiating to her back this morning.  Describes 10 out of 10 pain currently.  Patient's blood pressure is 113/93 we will hold off on nitroglycerin and instead dose fentanyl given a codeine allergy.  Given the description of pain originating in the chest and radiating to the back I would prefer to have a CT scan done of the chest to evaluate however the patient has an iodine contrast allergy with anaphylaxis.  States she received contrast in [place] and coded.  We will refrain from IV contrast at this time.  Patient also states it feels somewhat like indigestion to her and feels if she could burp he would feel better.  We will dose a GI cocktail as well.  Patient's pain is somewhat improved after medication, 7/10 currently.  Troponin has resulted 0.2.  I discussed the patient with cardiology Dr. Saralyn Pilar.  He recommends IV nitroglycerin to obtain pain control.  EKG is definitely abnormal but does not currently meet STEMI criteria.  We will attempt to control patient's chest pain started on heparin infusion and admit to the hospitalist  service.  He will be down to see the patient as well.   CRITICAL CARE Performed by: Harvest Dark   Total critical care time: 30 minutes  Critical care time  was exclusive of separately billable procedures and treating other patients.  Critical care was necessary to treat or prevent imminent or life-threatening deterioration.  Critical care was time spent personally by me on the following activities: development of treatment plan with patient and/or surrogate as well as nursing, discussions with consultants, evaluation of patient's response to treatment, examination of patient, obtaining history from patient or surrogate, ordering and performing treatments and interventions, ordering and review of laboratory studies, ordering and review of radiographic studies, pulse oximetry and re-evaluation of patient's condition.   ____________________________________________   FINAL CLINICAL IMPRESSION(S) / ED DIAGNOSES  NSTEMI   Harvest Dark, MD 07/16/18 1238

## 2018-07-16 NOTE — ED Notes (Signed)
Report was given to Charge RN on ICU unit.

## 2018-07-16 NOTE — Consult Note (Signed)
ANTICOAGULATION CONSULT NOTE - Initial Consult  Pharmacy Consult for heparin Indication: chest pain/ACS  Allergies  Allergen Reactions  . Bee Venom Anaphylaxis  . Iodinated Diagnostic Agents Anaphylaxis  . Iodine Anaphylaxis  . Clarithromycin Other (See Comments)    Generalized weakness Heart feels funny  . Codeine Nausea Only    Patient Measurements: Height: 5\' 7"  (170.2 cm) Weight: 198 lb 6.6 oz (90 kg) IBW/kg (Calculated) : 61.6 Heparin Dosing Weight: 80.9 kg  Vital Signs: Temp: 97.5 F (36.4 C) (01/05 1146) Temp Source: Oral (01/05 1146) BP: 113/93 (01/05 1146) Pulse Rate: 99 (01/05 1146)  Labs: Recent Labs    07/16/18 1146 07/16/18 1154  HGB 14.2 15.6*  HCT 45.5 46.0  PLT 331  --   CREATININE 0.65 0.70  TROPONINI 0.20*  --     Estimated Creatinine Clearance: 74.3 mL/min (by C-G formula based on SCr of 0.7 mg/dL).   Medical History: Past Medical History:  Diagnosis Date  . Back ache 11/26/2014  . Benign essential HTN 11/26/2014  . Bilateral tinnitus 11/26/2014  . Cancer ([place])    basal cell carcinoma on nose  . DDD (degenerative disc disease), cervical 11/26/2014  . 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 DVT (deep venous thrombosis) ([place])   . GERD (gastroesophageal reflux disease)   . Gout   . H/O malignant neoplasm of skin 06/20/2012  . Hemorrhoids   . History of anemia   . History of kidney stones   . History of pneumonia   . Numbness and tingling   . 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 PONV (postoperative nausea and vomiting)    "hard to come out of anesthesia"  . Renal colic 1/63/8453    Medications:  Scheduled:  . alum & mag hydroxide-simeth  30 mL Oral Once   And  . lidocaine  15 mL Oral Once  . aspirin  324 mg Oral Once  . fentaNYL (SUBLIMAZE) injection  50 mcg Intravenous Once  . ondansetron (ZOFRAN) IV  4 mg Intravenous Once    Assessment: 73 yo female presented with chest and back pain. Does not take any PTA  anticoagulation.  Goal of Therapy:  Heparin level 0.3-0.7 units/ml Monitor platelets by anticoagulation protocol: Yes   Plan:  Ordered baseline labs. Will order heparin 4000 unit bolus x 1, followed by 950 unit continuous infusion. Will follow up with [facility] at 2100. Pharmacy will continue to monitor.   Paticia Stack, PharmD Pharmacy Resident  07/16/2018 12:39 PM

## 2018-07-16 NOTE — Consult Note (Signed)
Reason for Consult: Admitted to the intensive care unit for NSTEMI Referring Physician: Hospitalist  [name] is an 73 y.o. female.  HPI: [name] is a very pleasant 73 year old female with a past medical history of degenerative disc disease, hypertension, deep venous thrombosis, gastroesophageal reflux disease, gout, nephrolithiasis, presented to the emergency department with complaint of chest pain and shortness of breath.  Patient states that her chest pain was anterior chest with radiation to the back.  She was brought into the emergency department and was noted to have ST segment changes on EKG.  Was started on heparin, nitroglycerin IV and is being admitted to the intensive care unit with cardiac catheterization planned for the morning  Past Medical History:  Diagnosis Date  . Back ache 11/26/2014  . Benign essential HTN 11/26/2014  . Bilateral tinnitus 11/26/2014  . Cancer ([place])    basal cell carcinoma on nose  . DDD (degenerative disc disease), cervical 11/26/2014  . 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 DVT (deep venous thrombosis) ([place])   . GERD (gastroesophageal reflux disease)   . Gout   . H/O malignant neoplasm of skin 06/20/2012  . Hemorrhoids   . History of anemia   . History of kidney stones   . History of pneumonia   . Numbness and tingling   . 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 PONV (postoperative nausea and vomiting)    "hard to come out of anesthesia"  . Renal colic 1/75/1025    Past Surgical History:  Procedure Laterality Date  . BREAST EXCISIONAL BIOPSY Right 2012  . CHOLECYSTECTOMY    . COLONOSCOPY WITH PROPOFOL N/A 12/26/2017   Procedure: COLONOSCOPY WITH PROPOFOL;  Surgeon: Manya Silvas, MD;  Location: [facility] ENDOSCOPY;  Service: Endoscopy;  Laterality: N/A;  . CYSTOSCOPY/RETROGRADE/URETEROSCOPY    . ESOPHAGOGASTRODUODENOSCOPY  12/26/2017   Procedure: ESOPHAGOGASTRODUODENOSCOPY (EGD);  Surgeon: Manya Silvas, MD;  Location: [facility] ENDOSCOPY;   Service: Endoscopy;;  . EYE SURGERY Left   . KNEE SURGERY Left   . lower back surgery    . NECK SURGERY     x4  . RETINAL DETACHMENT SURGERY Left   . SKIN CANCER EXCISION     nose  . TONSILLECTOMY    . TUBAL LIGATION    . WISDOM TOOTH EXTRACTION      Family History  Problem Relation Age of Onset  . Breast cancer Paternal Aunt   . Breast cancer Cousin     Social History:  reports that she has never smoked. She has never used smokeless tobacco. She reports that she does not drink alcohol or use drugs.  Allergies:  Allergies  Allergen Reactions  . Bee Venom Anaphylaxis  . Iodinated Diagnostic Agents Anaphylaxis  . Iodine Anaphylaxis  . Clarithromycin Other (See Comments)    Generalized weakness Heart feels funny  . Codeine Nausea Only    Medications: I have reviewed the patient's current medications.  Results for orders placed or performed during the hospital encounter of 07/16/18 (from the past 48 hour(s))  Basic metabolic panel     Status: Abnormal   Collection Time: 07/16/18 11:46 AM  Result Value Ref Range   Sodium 136 135 - 145 mmol/L   Potassium 4.1 3.5 - 5.1 mmol/L   Chloride 102 98 - 111 mmol/L   CO2 22 22 - 32 mmol/L   Glucose, Bld 104 (H) 70 - 99 mg/dL   BUN 18 8 - 23 mg/dL   Creatinine, Ser 0.65 0.44 - 1.00 mg/dL  Calcium 9.3 8.9 - 10.3 mg/dL   GFR calc non Af Amer >60 >60 mL/min   GFR calc Af Amer >60 >60 mL/min   Anion gap 12 5 - 15    Comment: Performed at [facility], [address] [place], [place] [address]  CBC     Status: Abnormal   Collection Time: 07/16/18 11:46 AM  Result Value Ref Range   WBC 10.7 (H) 4.0 - 10.5 K/uL   RBC 4.74 3.87 - 5.11 MIL/uL   Hemoglobin 14.2 12.0 - 15.0 g/dL   HCT 45.5 36.0 - 46.0 %   MCV 96.0 80.0 - 100.0 fL   MCH 30.0 26.0 - 34.0 pg   MCHC 31.2 30.0 - 36.0 g/dL   RDW 13.0 11.5 - 15.5 %   Platelets 331 150 - 400 K/uL   nRBC 0.0 0.0 - 0.2 %    Comment: Performed at [facility], [address] [place], [place] [address]  Troponin I - ONCE - STAT     Status: Abnormal   Collection Time: 07/16/18 11:46 AM  Result Value Ref Range   Troponin I 0.20 (HH) <0.03 ng/mL    Comment: CRITICAL RESULT CALLED TO, READ BACK BY AND VERIFIED WITH JANE RYAN 07/16/17 1220 SJL Performed at [facility], [address] [place], [place] [address]   I-STAT, [place] 8     Status: Abnormal   Collection Time: 07/16/18 11:54 AM  Result Value Ref Range   Sodium 137 135 - 145 mmol/L   Potassium 4.1 3.5 - 5.1 mmol/L   Chloride 102 98 - 111 mmol/L   BUN 18 8 - 23 mg/dL   Creatinine, Ser 0.70 0.44 - 1.00 mg/dL   Glucose, Bld 107 (H) 70 - 99 mg/dL   Calcium, Ion 1.11 (L) 1.15 - 1.40 mmol/L   TCO2 26 22 - 32 mmol/L   Hemoglobin 15.6 (H) 12.0 - 15.0 g/dL   HCT 46.0 36.0 - 46.0 %  Glucose, capillary     Status: Abnormal   Collection Time: 07/16/18  3:31 PM  Result Value Ref Range   Glucose-Capillary 112 (H) 70 - 99 mg/dL    Dg Chest Portable 1 View  Result Date: 07/16/2018 CLINICAL DATA:  Chest pain EXAM: PORTABLE CHEST 1 VIEW COMPARISON:  09/04/2010 FINDINGS: The heart size and mediastinal contours are within normal limits. Both lungs are clear. The visualized skeletal structures are unremarkable. IMPRESSION: No active disease. Electronically Signed   By: Kerby Moors M.D.   On: 07/16/2018 12:06    ROS  Patient complains of chronic back pain and neck pain.  Also had a recent fall complaining of knee pain.  Otherwise please see HPI for pertinent positives on review of systems, all else negative Blood pressure 110/66, pulse 100, temperature 98.7 F (37.1 C), temperature source Oral, resp. rate 18, height 5\' 5"  (1.651 m), weight 89.9 kg, SpO2 92 %. Physical Exam Patient is awake, alert, in no acute distress.  States that her pain is resolving on present therapy HEENT: Trachea is midline, no thyromegaly is noted, no oral lesions, no jugular venous distention Cardiovascular: regular  rhythm, tachycardia appreciated Abdominal: Positive bowel sounds, soft exam Pulmonary: Clear to auscultation Extremities: No clubbing, cyanosis or edema noted Neurologic: Cranial nerves are grossly intact without any focal deficits appreciated  Pertinent labs reveal troponin 0.2, EKG reveals sinus tachycardia, interventricular conduction delay, left axis deviation, repolarization abnormality.  Chest x-ray is clear  Assessment/Plan: NSTEMI.  Patient is on  heparin, IV nitroglycerin, aspirin.  Care being directed by cardiology.  Will be available to assist with care  Hermelinda Dellen, DO  [facility] [place] 07/16/2018, 3:50 PM

## 2018-07-16 NOTE — Consult Note (Signed)
ANTICOAGULATION CONSULT NOTE - Initial Consult  Pharmacy Consult for heparin Indication: chest pain/ACS  Allergies  Allergen Reactions  . Bee Venom Anaphylaxis  . Iodinated Diagnostic Agents Anaphylaxis  . Iodine Anaphylaxis  . Clarithromycin Other (See Comments)    Generalized weakness Heart feels funny  . Codeine Nausea Only    Patient Measurements: Height: 5\' 5"  (165.1 cm) Weight: 198 lb 3.1 oz (89.9 kg) IBW/kg (Calculated) : 57 Heparin Dosing Weight: 80.9 kg  Vital Signs: Temp: 99 F (37.2 C) (01/05 2000) Temp Source: Oral (01/05 2000) BP: 110/66 (01/05 1530) Pulse Rate: 94 (01/05 1600)  Labs: Recent Labs    07/16/18 1146 07/16/18 1154 07/16/18 1602 07/16/18 2007  HGB 14.2 15.6*  --   --   HCT 45.5 46.0  --   --   PLT 331  --   --   --   APTT  --   --  128*  --   LABPROT  --   --  13.5  --   INR  --   --  1.04  --   HEPARINUNFRC  --   --   --  0.36  CREATININE 0.65 0.70  --   --   TROPONINI 0.20*  --  7.99* 6.52*    Estimated Creatinine Clearance: 71.5 mL/min (by C-G formula based on SCr of 0.7 mg/dL).   Medical History: Past Medical History:  Diagnosis Date  . Back ache 11/26/2014  . Benign essential HTN 11/26/2014  . Bilateral tinnitus 11/26/2014  . Cancer ([place])    basal cell carcinoma on nose  . DDD (degenerative disc disease), cervical 11/26/2014  . 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 DVT (deep venous thrombosis) ([place])   . GERD (gastroesophageal reflux disease)   . Gout   . H/O malignant neoplasm of skin 06/20/2012  . Hemorrhoids   . History of anemia   . History of kidney stones   . History of pneumonia   . Numbness and tingling   . 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 PONV (postoperative nausea and vomiting)    "hard to come out of anesthesia"  . Renal colic 1/61/0960    Medications:  Scheduled:  . [START ON 07/17/2018] aspirin EC  81 mg Oral Daily  . docusate sodium  100 mg Oral BID  . docusate sodium  100 mg Oral BID  .  gabapentin  400 mg Oral BID  . gabapentin  800 mg Oral QHS  . psyllium  1 packet Oral Daily  . sodium chloride flush  3 mL Intravenous Q12H    Assessment: 73 yo female presented with chest and back pain. Does not take any PTA anticoagulation.  Goal of Therapy:  Heparin level 0.3-0.7 units/ml Monitor platelets by anticoagulation protocol: Yes   Plan:  HL therapeutic x 1 @ 2100 (0.36)  Continue 950 unit continuous infusion. Will follow up with 2nd HL 1/6 at 0500.   Pharmacy will continue to monitor.   Lu Duffel, PharmD, BCPS Clinical Pharmacist 07/16/2018 9:16 PM

## 2018-07-17 ENCOUNTER — Encounter: Payer: Self-pay | Admitting: Cardiology

## 2018-07-17 ENCOUNTER — Inpatient Hospital Stay
Admit: 2018-07-17 | Discharge: 2018-07-17 | Disposition: A | Discharge status: Home / Self Care | Payer: Medicare Other | Attending: Internal Medicine | Admitting: Internal Medicine

## 2018-07-17 ENCOUNTER — Encounter
Admission: EM | Disposition: A | Discharge status: Home / Self Care | Payer: Self-pay | Source: Home / Self Care | Attending: Internal Medicine

## 2018-07-17 HISTORY — PX: LEFT HEART CATH: CATH118248

## 2018-07-17 LAB — CBC
HCT: 41 % (ref 36.0–46.0)
HEMOGLOBIN: 13 g/dL (ref 12.0–15.0)
MCH: 29.8 pg (ref 26.0–34.0)
MCHC: 31.7 g/dL (ref 30.0–36.0)
MCV: 94 fL (ref 80.0–100.0)
NRBC: 0 % (ref 0.0–0.2)
Platelets: 315 10*3/uL (ref 150–400)
RBC: 4.36 MIL/uL (ref 3.87–5.11)
RDW: 13.1 % (ref 11.5–15.5)
WBC: 8 10*3/uL (ref 4.0–10.5)

## 2018-07-17 LAB — MAGNESIUM: Magnesium: 2 mg/dL (ref 1.7–2.4)

## 2018-07-17 LAB — BASIC METABOLIC PANEL
ANION GAP: 10 (ref 5–15)
BUN: 15 mg/dL (ref 8–23)
CO2: 22 mmol/L (ref 22–32)
Calcium: 8.9 mg/dL (ref 8.9–10.3)
Chloride: 105 mmol/L (ref 98–111)
Creatinine, Ser: 0.64 mg/dL (ref 0.44–1.00)
GFR calc Af Amer: >60 mL/min (ref >60)
GFR calc non Af Amer: >60 mL/min (ref >60)
Glucose, Bld: 166 mg/dL — ABNORMAL HIGH (ref 70–99)
POTASSIUM: 4 mmol/L (ref 3.5–5.1)
Sodium: 137 mmol/L (ref 135–145)

## 2018-07-17 LAB — ECHOCARDIOGRAM COMPLETE
Height: 65 in
Weight: 3125.24 oz

## 2018-07-17 LAB — TROPONIN I: Troponin I: 4.39 ng/mL (ref <0.03)

## 2018-07-17 LAB — HEPARIN LEVEL (UNFRACTIONATED): Heparin Unfractionated: 0.22 IU/mL — ABNORMAL LOW (ref 0.30–0.70)

## 2018-07-17 SURGERY — LEFT HEART CATH
Anesthesia: Moderate Sedation

## 2018-07-17 MED ORDER — ENOXAPARIN SODIUM 40 MG/0.4ML ~~LOC~~ SOLN
40.0000 mg | SUBCUTANEOUS | Status: DC
  Administered 2018-07-17: 40 mg via SUBCUTANEOUS
  Filled 2018-07-17: qty 0.4

## 2018-07-17 MED ORDER — DIPHENHYDRAMINE HCL 50 MG/ML IJ SOLN
50.0000 mg | Freq: Once | INTRAMUSCULAR | Status: AC
  Administered 2018-07-17: 50 mg via INTRAVENOUS

## 2018-07-17 MED ORDER — MIDAZOLAM HCL 2 MG/2ML IJ SOLN
INTRAMUSCULAR | Status: AC
  Filled 2018-07-17: qty 2

## 2018-07-17 MED ORDER — SODIUM CHLORIDE 0.9 % IV SOLN: 250.0000 mL | INTRAVENOUS | Status: DC | PRN

## 2018-07-17 MED ORDER — DIPHENHYDRAMINE HCL 50 MG/ML IJ SOLN
INTRAMUSCULAR | Status: AC
  Administered 2018-07-17: 50 mg via INTRAVENOUS
  Filled 2018-07-17: qty 1

## 2018-07-17 MED ORDER — MIDAZOLAM HCL 2 MG/2ML IJ SOLN
INTRAMUSCULAR | Status: DC | PRN
  Administered 2018-07-17: 1 mg via INTRAVENOUS

## 2018-07-17 MED ORDER — METOPROLOL SUCCINATE ER 25 MG PO TB24
25.0000 mg | ORAL_TABLET | Freq: Every day | ORAL | Status: DC
  Administered 2018-07-17 – 2018-07-20 (×3): 25 mg via ORAL
  Filled 2018-07-17 (×3): qty 1

## 2018-07-17 MED ORDER — SODIUM CHLORIDE 0.9 % IV SOLN
INTRAVENOUS | Status: AC | PRN
  Administered 2018-07-17: 250 mL via INTRAVENOUS

## 2018-07-17 MED ORDER — SODIUM CHLORIDE 0.9% FLUSH: 3.0000 mL | Freq: Two times a day (BID) | INTRAVENOUS | Status: DC

## 2018-07-17 MED ORDER — METHYLPREDNISOLONE SODIUM SUCC 125 MG IJ SOLR
INTRAMUSCULAR | Status: AC
  Filled 2018-07-17: qty 2

## 2018-07-17 MED ORDER — METHYLPREDNISOLONE SODIUM SUCC 40 MG IJ SOLR
40.0000 mg | INTRAMUSCULAR | Status: DC
  Administered 2018-07-17: 40 mg via INTRAVENOUS

## 2018-07-17 MED ORDER — DIPHENHYDRAMINE HCL 50 MG PO CAPS
50.0000 mg | ORAL_CAPSULE | Freq: Once | ORAL | Status: AC
  Filled 2018-07-17: qty 1

## 2018-07-17 MED ORDER — ASPIRIN 81 MG PO CHEW
81.0000 mg | CHEWABLE_TABLET | ORAL | Status: AC
  Administered 2018-07-17: 81 mg via ORAL

## 2018-07-17 MED ORDER — SODIUM CHLORIDE 0.9% FLUSH: 3.0000 mL | INTRAVENOUS | Status: DC | PRN

## 2018-07-17 MED ORDER — ACETAMINOPHEN 325 MG PO TABS: 650.0000 mg | ORAL_TABLET | ORAL | Status: DC | PRN

## 2018-07-17 MED ORDER — IOPAMIDOL (ISOVUE-300) INJECTION 61%
INTRAVENOUS | Status: DC | PRN
  Administered 2018-07-17: 100 mL via INTRA_ARTERIAL

## 2018-07-17 MED ORDER — HEPARIN (PORCINE) IN NACL 1000-0.9 UT/500ML-% IV SOLN
INTRAVENOUS | Status: AC
  Filled 2018-07-17: qty 1000

## 2018-07-17 MED ORDER — LISINOPRIL 5 MG PO TABS
5.0000 mg | ORAL_TABLET | Freq: Every day | ORAL | Status: DC
  Administered 2018-07-17 – 2018-07-20 (×3): 5 mg via ORAL
  Filled 2018-07-17 (×2): qty 1
  Filled 2018-07-17 (×2): qty 0.5
  Filled 2018-07-17: qty 1

## 2018-07-17 MED ORDER — FENTANYL CITRATE (PF) 100 MCG/2ML IJ SOLN
INTRAMUSCULAR | Status: DC | PRN
  Administered 2018-07-17: 25 ug via INTRAVENOUS

## 2018-07-17 MED ORDER — VERAPAMIL HCL 2.5 MG/ML IV SOLN
INTRAVENOUS | Status: AC
  Filled 2018-07-17: qty 2

## 2018-07-17 MED ORDER — HEPARIN BOLUS VIA INFUSION
1000.0000 [IU] | Freq: Once | INTRAVENOUS | Status: AC
  Administered 2018-07-17: 1000 [IU] via INTRAVENOUS
  Filled 2018-07-17: qty 1000

## 2018-07-17 MED ORDER — HEPARIN SODIUM (PORCINE) 1000 UNIT/ML IJ SOLN
INTRAMUSCULAR | Status: AC
  Filled 2018-07-17: qty 1

## 2018-07-17 MED ORDER — SODIUM CHLORIDE 0.9 % WEIGHT BASED INFUSION
1.0000 mL/kg/h | INTRAVENOUS | Status: AC
  Administered 2018-07-17: 1 mL/kg/h via INTRAVENOUS

## 2018-07-17 MED ORDER — SODIUM CHLORIDE 0.9 % WEIGHT BASED INFUSION
3.0000 mL/kg/h | INTRAVENOUS | Status: AC
  Administered 2018-07-17: 3 mL/kg/h via INTRAVENOUS

## 2018-07-17 MED ORDER — ONDANSETRON HCL 4 MG/2ML IJ SOLN: 4.0000 mg | Freq: Four times a day (QID) | INTRAMUSCULAR | Status: DC | PRN

## 2018-07-17 MED ORDER — HEPARIN SODIUM (PORCINE) 1000 UNIT/ML IJ SOLN
INTRAMUSCULAR | Status: DC | PRN
  Administered 2018-07-17: 4400 [IU] via INTRAVENOUS

## 2018-07-17 MED ORDER — FENTANYL CITRATE (PF) 100 MCG/2ML IJ SOLN
INTRAMUSCULAR | Status: AC
  Filled 2018-07-17: qty 2

## 2018-07-17 MED ORDER — SODIUM CHLORIDE 0.9 % WEIGHT BASED INFUSION: 1.0000 mL/kg/h | INTRAVENOUS | Status: DC

## 2018-07-17 MED ORDER — ASPIRIN 81 MG PO CHEW
CHEWABLE_TABLET | ORAL | Status: AC
  Administered 2018-07-17: 81 mg via ORAL
  Filled 2018-07-17: qty 1

## 2018-07-17 MED ORDER — SODIUM CHLORIDE 0.9% FLUSH
3.0000 mL | Freq: Two times a day (BID) | INTRAVENOUS | Status: DC
  Administered 2018-07-17 – 2018-07-18 (×2): 3 mL via INTRAVENOUS

## 2018-07-17 SURGICAL SUPPLY — 7 items
CATH INFINITI 5FR ANG PIGTAIL (CATHETERS) ×2 IMPLANT
CATH INFINITI 5FR TG (CATHETERS) ×2 IMPLANT
DEVICE RAD TR BAND REGULAR (VASCULAR PRODUCTS) ×2 IMPLANT
GLIDESHEATH SLEND SS 6F .021 (SHEATH) ×2 IMPLANT
KIT MANI 3VAL PERCEP (MISCELLANEOUS) ×3 IMPLANT
PACK CARDIAC CATH (CUSTOM PROCEDURE TRAY) ×3 IMPLANT
WIRE ROSEN-J .035X260CM (WIRE) ×2 IMPLANT

## 2018-07-17 NOTE — Progress Notes (Signed)
Nutrition Brief Note  Patient identified on the Malnutrition Screening Tool (MST) Report  Wt Readings from Last 15 Encounters:  07/17/18 88.6 kg  12/26/17 83.9 kg  11/15/17 82.1 kg  10/11/17 82.1 kg  09/06/17 81.6 kg  06/11/15 76.7 kg  06/03/15 76.7 kg  05/06/15 76 kg  03/25/15 30.27 kg   73 year old female with PMHx of HTN, cervical DDD, gout, hx DVT, anemia, GERD admitted with NSTEMI s/p cardiac catheterization this AM which found mildly reduced left ventricular function with apical wall akinesis consistent with stress-induced cardiomyopathy. Recommendation is for medical therapy.  Met with patient and her family at bedside. Patient reports her appetite is good and unchanged from baseline. At home she eats 3 meals per day. For breakfast she has eggs and [place] bacon. For lunch she has a Slim Fast shake. For dinner she has chicken with vegetables/fruit. She has been in [employer] trying to lose weight but has actually gained weight recently due to being less active (used to walk daily). She is eating well here and finishing 100% of meals. No subcutaneous fat or muscle wasting found on nutrition focused physical exam. Patient does not meet criteria for malnutrition. She endorses she has learned about the heart healthy diet before and also reads about it. She limits processed/fried foods, and tries to increase fiber from fruits and vegetables. She also limits trans/saturated fats and cooks with olive oil. No further nutrition questions or concerns at this time.  Body mass index is 32.5 kg/m. Patient meets criteria for obesity class I based on current BMI.   Current diet order is Heart Healthy, patient is consuming approximately 100% of meals at this time. Labs and medications reviewed.   No nutrition interventions warranted at this time. If nutrition issues arise, please consult RD.   Willey Blade, MS, [place], LDN Office: [phone] Pager: [phone] After Hours/Weekend Pager:  [phone]

## 2018-07-17 NOTE — Progress Notes (Signed)
*  PRELIMINARY RESULTS* Echocardiogram 2D Echocardiogram has been performed.  Sherrie Sport 07/17/2018, 1:46 PM

## 2018-07-17 NOTE — Treatment Plan (Signed)
Patient had cardiac catheterization today via the right radial route.  The findings were consistent with stress-induced cardiomyopathy (Takotsubo's cardiomyopathy).  Management of being directed by cardiology.  From our standpoint she can be transferred to telemetry.  Remain available if needed.

## 2018-07-17 NOTE — Progress Notes (Signed)
TR band removed with no complications. Pt has been up and ambulated to [facility] with assistance. Will continue to monitor.

## 2018-07-17 NOTE — Progress Notes (Signed)
[name] at [facility] NAME: Alexa Blish    MR#:  [id number]  DATE OF BIRTH:  1946-05-08  SUBJECTIVE:  CHIEF COMPLAINT:   Chief Complaint  Patient presents with  . Chest Pain   No new complaint this morning.  Patient denies any chest pain.  Had cardiac catheterization done today.  Having breakfast.  Family members present at bedside.  REVIEW OF SYSTEMS:  Review of Systems  Constitutional: Negative for chills and fever.  HENT: Negative for hearing loss and tinnitus.   Eyes: Negative for blurred vision and double vision.  Respiratory: Negative for cough and hemoptysis.   Cardiovascular: Negative for chest pain, palpitations and leg swelling.  Gastrointestinal: Negative for heartburn and nausea.  Genitourinary: Negative for dysuria and urgency.  Musculoskeletal: Negative for myalgias and neck pain.  Skin: Negative for itching and rash.  Neurological: Negative for dizziness and headaches.  Psychiatric/Behavioral: Negative for depression and hallucinations.    DRUG ALLERGIES:   Allergies  Allergen Reactions  . Bee Venom Anaphylaxis  . Iodinated Diagnostic Agents Anaphylaxis  . Iodine Anaphylaxis  . Clarithromycin Other (See Comments)    Generalized weakness Heart feels funny  . Codeine Nausea Only   VITALS:  Blood pressure 99/67, pulse 87, temperature 98.3 F (36.8 C), temperature source Oral, resp. rate 18, height 5\' 5"  (1.651 m), weight 88.6 kg, SpO2 99 %. PHYSICAL EXAMINATION:   Physical Exam  Constitutional: She is oriented to person, place, and time and well-developed, well-nourished, and in no distress.  HENT:  Head: Normocephalic and atraumatic.  Eyes: Pupils are equal, round, and reactive to light. Conjunctivae are normal. Right eye exhibits no discharge.  Neck: Neck supple. No tracheal deviation present.  Cardiovascular: Normal rate, regular rhythm and normal heart sounds.  Pulmonary/Chest: Effort normal and breath  sounds normal. She has no wheezes.  Abdominal: Soft. Bowel sounds are normal. She exhibits no distension. There is no abdominal tenderness.  Musculoskeletal: Normal range of motion.        General: No edema.  Neurological: She is oriented to person, place, and time.  Skin: Skin is warm. No rash noted. She is not diaphoretic.   LABORATORY PANEL:  Female CBC Recent Labs  Lab 07/17/18 0532  WBC 8.0  HGB 13.0  HCT 41.0  PLT 315   ------------------------------------------------------------------------------------------------------------------ Chemistries  Recent Labs  Lab 07/17/18 0051 07/17/18 0532  NA  --  137  K  --  4.0  CL  --  105  CO2  --  22  GLUCOSE  --  166*  BUN  --  15  CREATININE  --  0.64  CALCIUM  --  8.9  MG 2.0  --    RADIOLOGY:  No results found. ASSESSMENT AND PLAN:   1. Non-ST elevation MI.  Patient was started on heparin drip and admitted to the stepdown unit Patient had cardiac catheterization done  today and was reported to have been negative.   2D echocardiogram done with moderately depressed left ventricular function with EF of 30 to 35%.  Anterior/apical hypokinesis.  Findings likely due to Takotsubo cardiomyopathy Plans for transfer out of ICU once bed available.  Heparin drip already discontinued.  Continue metoprolol and aspirin.  2.  Hypertension Blood pressure controlled on current regimen.  3. Chronic back pain.  Continue Neurontin from home Stable and controlled  DVT prophylaxis.    Lovenox  CODE STATUS; full code  Management plans discussed with the patient, family and  they are in agreement.   TOTAL TIME TAKING CARE OF THIS PATIENT: 35 minutes.   More than 50% of the time was spent in counseling/coordination of care: YES  POSSIBLE D/C IN  1 DAY, DEPENDING ON CLINICAL CONDITION.   Wilkin Lippy M.D on 07/17/2018 at 4:07 PM  Between 7am to 6pm - Pager - [phone]  After 6pm go to [email or web address] - Proofreader  [employer] [place] [facility]  Office  [phone]  CC: Primary care physician; Glendon Axe, MD  Note: This dictation was prepared with Dragon dictation along with smaller phrase technology. Any transcriptional errors that result from this process are unintentional.

## 2018-07-17 NOTE — Consult Note (Signed)
ANTICOAGULATION CONSULT NOTE - Initial Consult  Pharmacy Consult for heparin Indication: chest pain/ACS  Allergies  Allergen Reactions  . Bee Venom Anaphylaxis  . Iodinated Diagnostic Agents Anaphylaxis  . Iodine Anaphylaxis  . Clarithromycin Other (See Comments)    Generalized weakness Heart feels funny  . Codeine Nausea Only    Patient Measurements: Height: 5\' 5"  (165.1 cm) Weight: 195 lb 5.2 oz (88.6 kg) IBW/kg (Calculated) : 57 Heparin Dosing Weight: 80.9 kg  Vital Signs: Temp: 97.7 F (36.5 C) (01/06 0200) Temp Source: Oral (01/06 0200) BP: 114/56 (01/06 0600) Pulse Rate: 82 (01/06 0600)  Labs: Recent Labs    07/16/18 1146 07/16/18 1154 07/16/18 1602 07/16/18 2007 07/17/18 0051 07/17/18 0532  HGB 14.2 15.6*  --   --   --  13.0  HCT 45.5 46.0  --   --   --  41.0  PLT 331  --   --   --   --  315  APTT  --   --  128*  --   --   --   LABPROT  --   --  13.5  --   --   --   INR  --   --  1.04  --   --   --   HEPARINUNFRC  --   --   --  0.36  --  0.22*  CREATININE 0.65 0.70  --   --   --  0.64  TROPONINI 0.20*  --  7.99* 6.52* 4.39*  --     Estimated Creatinine Clearance: 70.9 mL/min (by C-G formula based on SCr of 0.64 mg/dL).   Medical History: Past Medical History:  Diagnosis Date  . Back ache 11/26/2014  . Benign essential HTN 11/26/2014  . Bilateral tinnitus 11/26/2014  . Cancer ([place])    basal cell carcinoma on nose  . DDD (degenerative disc disease), cervical 11/26/2014  . 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 DVT (deep venous thrombosis) ([place])   . GERD (gastroesophageal reflux disease)   . Gout   . H/O malignant neoplasm of skin 06/20/2012  . Hemorrhoids   . History of anemia   . History of kidney stones   . History of pneumonia   . Numbness and tingling   . 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 PONV (postoperative nausea and vomiting)    "hard to come out of anesthesia"  . Renal colic 4/00/8676    Medications:  Scheduled:  . [START  ON 07/18/2018] aspirin  81 mg Oral Pre-Cath  . aspirin EC  81 mg Oral Daily  . diphenhydrAMINE  50 mg Oral Once   Or  . diphenhydrAMINE  50 mg Intravenous Once  . docusate sodium  100 mg Oral BID  . docusate sodium  100 mg Oral BID  . gabapentin  400 mg Oral BID  . gabapentin  800 mg Oral QHS  . methylPREDNISolone (SOLU-MEDROL) injection  40 mg Intravenous Q4H  . psyllium  1 packet Oral Daily  . sodium chloride flush  3 mL Intravenous Q12H  . sodium chloride flush  3 mL Intravenous Q12H    Assessment: 73 yo female presented with chest and back pain. Does not take any PTA anticoagulation.  Goal of Therapy:  Heparin level 0.3-0.7 units/ml Monitor platelets by anticoagulation protocol: Yes   Plan:  01/06 @ 0530 HL 0.22 subtherapeutic. Will rebolus w/ heparin 1000 units IV x 1 and will increase rate to 1050 units/hr and will recheck HL @ 1400, CBC trending  down will continue to monitor.  Tobie Lords, PharmD, BCPS Clinical Pharmacist 07/17/2018 7:02 AM

## 2018-07-18 ENCOUNTER — Inpatient Hospital Stay: Payer: Medicare Other

## 2018-07-18 LAB — CBC
HCT: 41.6 % (ref 36.0–46.0)
HCT: 41.1 % (ref 36.0–46.0)
Hemoglobin: 12.7 g/dL (ref 12.0–15.0)
Hemoglobin: 12.9 g/dL (ref 12.0–15.0)
MCH: 29.8 pg (ref 26.0–34.0)
MCH: 30.1 pg (ref 26.0–34.0)
MCHC: 30.5 g/dL (ref 30.0–36.0)
MCHC: 31.4 g/dL (ref 30.0–36.0)
MCV: 97.7 fL (ref 80.0–100.0)
MCV: 95.8 fL (ref 80.0–100.0)
PLATELETS: 291 10*3/uL (ref 150–400)
Platelets: 287 10*3/uL (ref 150–400)
RBC: 4.26 MIL/uL (ref 3.87–5.11)
RBC: 4.29 MIL/uL (ref 3.87–5.11)
RDW: 13.4 % (ref 11.5–15.5)
RDW: 13.5 % (ref 11.5–15.5)
WBC: 18.2 10*3/uL — AB (ref 4.0–10.5)
WBC: 18.6 10*3/uL — AB (ref 4.0–10.5)
nRBC: 0 % (ref 0.0–0.2)
nRBC: 0 % (ref 0.0–0.2)

## 2018-07-18 LAB — BASIC METABOLIC PANEL
Anion gap: 8 (ref 5–15)
BUN: 20 mg/dL (ref 8–23)
CO2: 26 mmol/L (ref 22–32)
Calcium: 8.5 mg/dL — ABNORMAL LOW (ref 8.9–10.3)
Chloride: 104 mmol/L (ref 98–111)
Creatinine, Ser: 0.68 mg/dL (ref 0.44–1.00)
GFR calc non Af Amer: >60 mL/min (ref >60)
Glucose, Bld: 98 mg/dL (ref 70–99)
Potassium: 3.8 mmol/L (ref 3.5–5.1)
Sodium: 138 mmol/L (ref 135–145)

## 2018-07-18 LAB — URINALYSIS, ROUTINE W REFLEX MICROSCOPIC
Bacteria, UA: NONE SEEN
Bilirubin Urine: NEGATIVE
Glucose, UA: NEGATIVE mg/dL
Ketones, ur: NEGATIVE mg/dL
Leukocytes, UA: NEGATIVE
Nitrite: NEGATIVE
Protein, ur: NEGATIVE mg/dL
Specific Gravity, Urine: 1.004 — ABNORMAL LOW (ref 1.005–1.030)
pH: 6 (ref 5.0–8.0)

## 2018-07-18 LAB — MAGNESIUM: Magnesium: 2.1 mg/dL (ref 1.7–2.4)

## 2018-07-18 MED ORDER — PIPERACILLIN-TAZOBACTAM 3.375 G IVPB
3.3750 g | Freq: Three times a day (TID) | INTRAVENOUS | Status: DC
  Administered 2018-07-18 – 2018-07-20 (×5): 3.375 g via INTRAVENOUS
  Filled 2018-07-18 (×5): qty 50

## 2018-07-18 NOTE — Progress Notes (Signed)
Spoke to Dr. Stark Jock regarding low BP. Will hold BP meds tonight and watch her.

## 2018-07-18 NOTE — Progress Notes (Signed)
Patient Name: [name] Date of Encounter: 07/18/2018  Hospital Problem List     Active Problems:   NSTEMI (non-ST elevated myocardial infarction) [facility])    Patient Profile     Patient with history of hypertension, hiatal hernia, DVT who presented to the emergency room with chest pain.  Had mild troponin elevation.  Underwent cardiac catheterization revealing no significant coronary disease.  Felt to have Takotsubo syndrome peers to be doing well post cath.  Desires to go home.  Subjective   Doing well post cath with stress-induced cardiomyopathy.  Medical management.  Inpatient Medications    . aspirin EC  81 mg Oral Daily  . docusate sodium  100 mg Oral BID  . enoxaparin (LOVENOX) injection  40 mg Subcutaneous Q24H  . gabapentin  400 mg Oral BID  . gabapentin  800 mg Oral QHS  . lisinopril  5 mg Oral Daily  . metoprolol succinate  25 mg Oral Daily  . psyllium  1 packet Oral Daily  . sodium chloride flush  3 mL Intravenous Q12H  . sodium chloride flush  3 mL Intravenous Q12H    Vital Signs    Vitals:   07/18/18 1000 07/18/18 1100 07/18/18 1200 07/18/18 1300  BP: 115/84 112/74 (!) 112/99 (!) 96/37  Pulse: 97 (!) 102 97 89  Resp: 13 18 15  (!) 30  Temp:      TempSrc:      SpO2:      Weight:      Height:        Intake/Output Summary (Last 24 hours) at 07/18/2018 1400 Last data filed at 07/18/2018 1321 Gross per 24 hour  Intake 800 ml  Output 200 ml  Net 600 ml   Filed Weights   07/16/18 1146 07/16/18 1530 07/17/18 0457  Weight: 90 kg 89.9 kg 88.6 kg    Physical Exam    GEN: Well nourished, well developed, in no acute distress.  HEENT: normal.  Neck: Supple, no JVD, carotid bruits, or masses. Cardiac: RRR, no murmurs, rubs, or gallops. No clubbing, cyanosis, edema.  Radials/DP/PT 2+ and equal bilaterally.  Respiratory:  Respirations regular and unlabored, clear to auscultation bilaterally. GI: Soft, nontender, nondistended, BS + x 4. MS: no deformity or  atrophy. Skin: warm and dry, no rash. Neuro:  Strength and sensation are intact. Psych: Normal affect.  Labs    CBC Recent Labs    07/17/18 0532 07/18/18 0518  WBC 8.0 18.2*  HGB 13.0 12.7  HCT 41.0 41.6  MCV 94.0 97.7  PLT 315 211   Basic Metabolic Panel Recent Labs    07/16/18 2007 07/17/18 0051 07/17/18 0532 07/18/18 0518  NA  --   --  137 138  K  --   --  4.0 3.8  CL  --   --  105 104  CO2  --   --  22 26  GLUCOSE  --   --  166* 98  BUN  --   --  15 20  CREATININE  --   --  0.64 0.68  CALCIUM  --   --  8.9 8.5*  MG  --  2.0  --  2.1  PHOS 3.6  --   --   --    Liver Function Tests No results for input(s): AST, ALT, ALKPHOS, BILITOT, PROT, ALBUMIN in the last 72 hours. No results for input(s): LIPASE, AMYLASE in the last 72 hours. Cardiac Enzymes Recent Labs    07/16/18 1602  07/16/18 2007 07/17/18 0051  TROPONINI 7.99* 6.52* 4.39*   BNP No results for input(s): BNP in the last 72 hours. D-Dimer No results for input(s): DDIMER in the last 72 hours. Hemoglobin A1C No results for input(s): HGBA1C in the last 72 hours. Fasting Lipid Panel No results for input(s): CHOL, HDL, LDLCALC, TRIG, CHOLHDL, LDLDIRECT in the last 72 hours. Thyroid Function Tests No results for input(s): TSH, T4TOTAL, T3FREE, THYROIDAB in the last 72 hours.  Invalid input(s): FREET3  Telemetry    Normal sinus rhythm  ECG    Normal sinus rhythm with no ischemia  Radiology    Dg Chest Portable 1 View  Result Date: 07/16/2018 CLINICAL DATA:  Chest pain EXAM: PORTABLE CHEST 1 VIEW COMPARISON:  09/04/2010 FINDINGS: The heart size and mediastinal contours are within normal limits. Both lungs are clear. The visualized skeletal structures are unremarkable. IMPRESSION: No active disease. Electronically Signed   By: Kerby Moors M.D.   On: 07/16/2018 12:06    Assessment & Plan    Patient admitted with chest pain.  Had a mild troponin elevation.  Echo revealed EF of 30 to 35% with  apical hypokinesis.  Underwent left cardiac catheterization revealed no significant coronary disease.  Is currently doing well on aspirin 81 mg daily, lisinopril 5 mg daily and metoprolol succinate 24 mg daily.  Okay for discharge from cardiac standpoint.  Follow-up with Dr. Saralyn Pilar in 1 week.  Signed, Javier Docker Taniesha Glanz MD 07/18/2018, 2:00 PM  Pager: [phone]

## 2018-07-18 NOTE — Progress Notes (Signed)
[facility] [place] NAME: [name]    MR#:  [id number]  DATE OF BIRTH:  20-Mar-1946  SUBJECTIVE:  CHIEF COMPLAINT:   Chief Complaint  Patient presents with  . Chest Pain   No new complaint this morning.  Patient denies any chest pain.  Had cardiac catheterization done yesterday which was negative.  This morning patient requesting to be discharged home.  Was cleared for discharge by cardiology service.  On further review of labs, patient noted to have significant leukocytosis with white count of 18,000.  Appears to be having some low-grade temperature.  Discharge plans consult.  Plans for transfer out of ICU once bed available.  REVIEW OF SYSTEMS:  Review of Systems  Constitutional: Negative for chills and fever.  HENT: Negative for hearing loss and tinnitus.   Eyes: Negative for blurred vision and double vision.  Respiratory: Negative for cough and hemoptysis.   Cardiovascular: Negative for chest pain, palpitations and leg swelling.  Gastrointestinal: Negative for heartburn and nausea.  Genitourinary: Negative for dysuria and urgency.  Musculoskeletal: Negative for myalgias and neck pain.  Skin: Negative for itching and rash.  Neurological: Negative for dizziness and headaches.  Psychiatric/Behavioral: Negative for depression and hallucinations.    DRUG ALLERGIES:   Allergies  Allergen Reactions  . Bee Venom Anaphylaxis  . Iodinated Diagnostic Agents Anaphylaxis  . Iodine Anaphylaxis  . Clarithromycin Other (See Comments)    Generalized weakness Heart feels funny  . Codeine Nausea Only   VITALS:  Blood pressure (!) 96/37, pulse 89, temperature 99.1 F (37.3 C), temperature source Oral, resp. rate (!) 30, height 5\' 5"  (1.651 m), weight 88.6 kg, SpO2 99 %. PHYSICAL EXAMINATION:   Physical Exam  Constitutional: She is oriented to person, place, and time and well-developed, well-nourished, and in no distress.  HENT:  Head:  Normocephalic and atraumatic.  Eyes: Pupils are equal, round, and reactive to light. Conjunctivae are normal. Right eye exhibits no discharge.  Neck: Neck supple. No tracheal deviation present.  Cardiovascular: Normal rate, regular rhythm and normal heart sounds.  Pulmonary/Chest: Effort normal and breath sounds normal. She has no wheezes.  Abdominal: Soft. Bowel sounds are normal. She exhibits no distension. There is no abdominal tenderness.  Musculoskeletal: Normal range of motion.        General: No edema.  Neurological: She is oriented to person, place, and time.  Skin: Skin is warm. No rash noted. She is not diaphoretic.   LABORATORY PANEL:  Female CBC Recent Labs  Lab 07/18/18 1436  WBC 18.6*  HGB 12.9  HCT 41.1  PLT 287   ------------------------------------------------------------------------------------------------------------------ Chemistries  Recent Labs  Lab 07/18/18 0518  NA 138  K 3.8  CL 104  CO2 26  GLUCOSE 98  BUN 20  CREATININE 0.68  CALCIUM 8.5*  MG 2.1   RADIOLOGY:  No results found. ASSESSMENT AND PLAN:   1. Non-ST elevation MI.  Patient was started on heparin drip and admitted to the stepdown unit Patient had cardiac catheterization done  today and was reported to have been negative.   2D echocardiogram done with moderately depressed left ventricular function with EF of 30 to 35%.  Anterior/apical hypokinesis.  Findings likely due to Takotsubo cardiomyopathy Plans for transfer out of ICU once bed available.  Heparin drip already discontinued.  Continue metoprolol and aspirin and lisinopril.  2.  Hypertension Blood pressure controlled on current regimen.  To hold blood pressure meds if blood pressure  begins to trend down.  3. Chronic back pain.  Continue Neurontin from home Stable and controlled  4.  Leukocytosis with low-grade temperature of 99.1 Repeated CBC with persistence of leukocytosis. High clinical suspicion of developing  infection. Requested for stat chest x-ray, blood cultures and urinalysis. Initiated empiric antibiotics with IV Zosyn pending results of work-up above. Plans for discharge canceled for today.  DVT prophylaxis.    Lovenox  CODE STATUS; full code  Management plans discussed with the patient, family and they are in agreement.   TOTAL TIME TAKING CARE OF THIS PATIENT: 36 minutes.   More than 50% of the time was spent in counseling/coordination of care: YES  POSSIBLE D/C IN  1 DAY, DEPENDING ON CLINICAL CONDITION.   Tres Grzywacz M.D on 07/18/2018 at 3:51 PM  Between 7am to 6pm - Pager - [phone]  After 6pm go to [email or web address] - Proofreader  [facility]  Office  [phone]  CC: Primary care physician; Glendon Axe, MD  Note: This dictation was prepared with Dragon dictation along with smaller phrase technology. Any transcriptional errors that result from this process are unintentional.

## 2018-07-18 NOTE — Progress Notes (Signed)
Patient awakened confused with multiple attempts to get OOB, pt oriented to self and easily reoriented to time and situation. Continues to have flight of ideas.Assisted patient to call husband per her request. Patient appears calmer, will continue to assess for changes/need.

## 2018-07-18 NOTE — Progress Notes (Signed)
Assumed care of patient. No acute problems noted. Refer to flowsheet for focused assessment. Family at bedside.

## 2018-07-19 ENCOUNTER — Encounter: Payer: Self-pay | Admitting: *Deleted

## 2018-07-19 LAB — CBC
HCT: 39.2 % (ref 36.0–46.0)
Hemoglobin: 12.4 g/dL (ref 12.0–15.0)
MCH: 30.5 pg (ref 26.0–34.0)
MCHC: 31.6 g/dL (ref 30.0–36.0)
MCV: 96.3 fL (ref 80.0–100.0)
Platelets: 251 10*3/uL (ref 150–400)
RBC: 4.07 MIL/uL (ref 3.87–5.11)
RDW: 13.7 % (ref 11.5–15.5)
WBC: 12.1 10*3/uL — AB (ref 4.0–10.5)
nRBC: 0 % (ref 0.0–0.2)

## 2018-07-19 LAB — BASIC METABOLIC PANEL
Anion gap: 9 (ref 5–15)
BUN: 19 mg/dL (ref 8–23)
CO2: 24 mmol/L (ref 22–32)
Calcium: 8.3 mg/dL — ABNORMAL LOW (ref 8.9–10.3)
Chloride: 105 mmol/L (ref 98–111)
Creatinine, Ser: 0.8 mg/dL (ref 0.44–1.00)
GFR calc Af Amer: >60 mL/min (ref >60)
GFR calc non Af Amer: >60 mL/min (ref >60)
Glucose, Bld: 102 mg/dL — ABNORMAL HIGH (ref 70–99)
Potassium: 3.9 mmol/L (ref 3.5–5.1)
Sodium: 138 mmol/L (ref 135–145)

## 2018-07-19 LAB — MAGNESIUM: Magnesium: 1.9 mg/dL (ref 1.7–2.4)

## 2018-07-19 LAB — PHOSPHORUS: Phosphorus: 3.9 mg/dL (ref 2.5–4.6)

## 2018-07-19 MED ORDER — ENOXAPARIN SODIUM 40 MG/0.4ML ~~LOC~~ SOLN
40.0000 mg | SUBCUTANEOUS | Status: DC
  Administered 2018-07-19: 40 mg via SUBCUTANEOUS
  Filled 2018-07-19: qty 0.4

## 2018-07-19 MED ORDER — SODIUM CHLORIDE 0.9 % IV SOLN
INTRAVENOUS | Status: DC | PRN
  Administered 2018-07-19 – 2018-07-20 (×3): 500 mL via INTRAVENOUS

## 2018-07-19 MED ORDER — FUROSEMIDE 20 MG PO TABS
20.0000 mg | ORAL_TABLET | Freq: Every day | ORAL | Status: DC
  Administered 2018-07-19 – 2018-07-20 (×2): 20 mg via ORAL
  Filled 2018-07-19 (×2): qty 1

## 2018-07-19 MED ORDER — LACTULOSE 10 GM/15ML PO SOLN
30.0000 g | Freq: Once | ORAL | Status: AC
  Administered 2018-07-19: 30 g via ORAL
  Filled 2018-07-19: qty 60

## 2018-07-19 NOTE — Progress Notes (Signed)
[facility]  SUBJECTIVE: The patient denies chest pain or shortness of breath. She has not had any recent palpitations. She requests to go home.   Vitals:   07/18/18 1957 07/19/18 0412 07/19/18 0731 07/19/18 0844  BP: 92/62 96/70 (!) 90/42 95/62  Pulse: 85 83 79 84  Resp: 16 17 19 18   Temp: 98.2 F (36.8 C) 98.3 F (36.8 C) 97.9 F (36.6 C)   TempSrc: Oral Oral Oral   SpO2: 99% 99% 94% 96%  Weight:  89.6 kg    Height:         Intake/Output Summary (Last 24 hours) at 07/19/2018 1211 Last data filed at 07/19/2018 1100 Gross per 24 hour  Intake 820 ml  Output 1900 ml  Net -1080 ml      PHYSICAL EXAM  General: Well developed, well nourished, in no acute distress HEENT:  Normocephalic and atramatic Neck:  No JVD.  Lungs: Clear bilaterally to auscultation and percussion. Heart: HRRR . Normal S1 and S2 without gallops or murmurs.  Abdomen: Bowel sounds are positive, abdomen soft and non-tender  Msk:  Back normal, normal gait. Normal strength and tone for age. Extremities: No clubbing, cyanosis or edema.   Neuro: Alert and oriented X 3. Psych:  Good affect, responds appropriately   LABS: Basic Metabolic Panel: Recent Labs    07/16/18 2007  07/18/18 0518 07/19/18 0353  NA  --    < > 138 138  K  --    < > 3.8 3.9  CL  --    < > 104 105  CO2  --    < > 26 24  GLUCOSE  --    < > 98 102*  BUN  --    < > 20 19  CREATININE  --    < > 0.68 0.80  CALCIUM  --    < > 8.5* 8.3*  MG  --    < > 2.1 1.9  PHOS 3.6  --   --  3.9   < > = values in this interval not displayed.   Liver Function Tests: No results for input(s): AST, ALT, ALKPHOS, BILITOT, PROT, ALBUMIN in the last 72 hours. No results for input(s): LIPASE, AMYLASE in the last 72 hours. CBC: Recent Labs    07/18/18 1436 07/19/18 0353  WBC 18.6* 12.1*  HGB 12.9 12.4  HCT 41.1 39.2  MCV 95.8 96.3  PLT 287 251   Cardiac Enzymes: Recent Labs    07/16/18 1602 07/16/18 2007 07/17/18 0051  TROPONINI 7.99*  6.52* 4.39*   BNP: Invalid input(s): POCBNP D-Dimer: No results for input(s): DDIMER in the last 72 hours. Hemoglobin A1C: No results for input(s): HGBA1C in the last 72 hours. Fasting Lipid Panel: No results for input(s): CHOL, HDL, LDLCALC, TRIG, CHOLHDL, LDLDIRECT in the last 72 hours. Thyroid Function Tests: No results for input(s): TSH, T4TOTAL, T3FREE, THYROIDAB in the last 72 hours.  Invalid input(s): FREET3 Anemia Panel: No results for input(s): VITAMINB12, FOLATE, FERRITIN, TIBC, IRON, RETICCTPCT in the last 72 hours.  Dg Chest 1 View  Result Date: 07/18/2018 CLINICAL DATA:  Leukocytosis EXAM: CHEST  1 VIEW COMPARISON:  07/16/2018 FINDINGS: Cardiac shadow is within normal limits and stable. Mild aortic calcifications are seen. The lungs are well aerated bilaterally without focal infiltrate or sizable effusion. No acute bony abnormality is seen. IMPRESSION: No acute abnormality noted. Electronically Signed   By: Inez Catalina M.D.   On: 07/18/2018 16:21     Echo:  Stress induced cardiomyopathy, 30-35%, anterior/apical hypokinesis  TELEMETRY:   ASSESSMENT AND PLAN:  Active Problems:   NSTEMI (non-ST elevated myocardial infarction) ([place])    1. 29-beat run of wide complex irregular tachycardia, appears to be atrial fibrillation with aberrancy in light of patient's baseline right bundle branch block, less likely ventricular tachycardia. The patient was asymptomatic. 2. Hypotension, blood pressure medications were held this morning 3. Takotsubo cardiomyopathy with LVEF 30-35%, cardiac catheterization revealing insignificant CAD. 4. Leukocytosis, empiric antibiotics. Blood cultures negative. Chest xray negative. WBC lower today, 12.1 from 18.6  Recommendations: 1. Continue monitoring on telemetry. 2. Resume metoprolol succinate 25 mg daily 3. Hold other blood pressure medications at this time    Clabe Seal, Hershal Coria 07/19/2018 12:11 PM

## 2018-07-19 NOTE — Progress Notes (Signed)
[facility] [place] [place] NAME: Jessee Newnam    MR#:  [id number]  DATE OF BIRTH:  Sep 12, 1945  SUBJECTIVE:  CHIEF COMPLAINT:   Chief Complaint  Patient presents with  . Chest Pain   No new complaint this morning.  Patient denies any chest pain.  No fevers.  Had cardiac catheterization done recently which was negative.   Later notified by nursing staff that patient had abnormal rhythm with suspicion of V. tach on telemetry.  Cardiology evaluated patient and reviewed the strips and felt to be probable atrial fibrillation with aberrancy.  Recommendation was to restart metoprolol and observe on telemetry.  REVIEW OF SYSTEMS:  Review of Systems  Constitutional: Negative for chills and fever.  HENT: Negative for hearing loss and tinnitus.   Eyes: Negative for blurred vision and double vision.  Respiratory: Negative for cough and hemoptysis.   Cardiovascular: Negative for chest pain, palpitations and leg swelling.  Gastrointestinal: Negative for heartburn and nausea.  Genitourinary: Negative for dysuria and urgency.  Musculoskeletal: Negative for myalgias and neck pain.  Skin: Negative for itching and rash.  Neurological: Negative for dizziness and headaches.  Psychiatric/Behavioral: Negative for depression and hallucinations.    DRUG ALLERGIES:   Allergies  Allergen Reactions  . Bee Venom Anaphylaxis  . Iodinated Diagnostic Agents Anaphylaxis  . Iodine Anaphylaxis  . Clarithromycin Other (See Comments)    Generalized weakness Heart feels funny  . Codeine Nausea Only   VITALS:  Blood pressure 97/76, pulse 90, temperature 97.9 F (36.6 C), temperature source Oral, resp. rate 19, height 5\' 5"  (1.651 m), weight 89.6 kg, SpO2 97 %. PHYSICAL EXAMINATION:   Physical Exam  Constitutional: She is oriented to person, place, and time and well-developed, well-nourished, and in no distress.  HENT:  Head: Normocephalic and atraumatic.  Eyes: Pupils  are equal, round, and reactive to light. Conjunctivae are normal. Right eye exhibits no discharge.  Neck: Neck supple. No tracheal deviation present.  Cardiovascular: Normal rate, regular rhythm and normal heart sounds.  Pulmonary/Chest: Effort normal and breath sounds normal. She has no wheezes.  Abdominal: Soft. Bowel sounds are normal. She exhibits no distension. There is no abdominal tenderness.  Musculoskeletal: Normal range of motion.        General: No edema.  Neurological: She is oriented to person, place, and time.  Skin: Skin is warm. No rash noted. She is not diaphoretic.   LABORATORY PANEL:  Female CBC Recent Labs  Lab 07/19/18 0353  WBC 12.1*  HGB 12.4  HCT 39.2  PLT 251   ------------------------------------------------------------------------------------------------------------------ Chemistries  Recent Labs  Lab 07/19/18 0353  NA 138  K 3.9  CL 105  CO2 24  GLUCOSE 102*  BUN 19  CREATININE 0.80  CALCIUM 8.3*  MG 1.9   RADIOLOGY:  Dg Chest 1 View  Result Date: 07/18/2018 CLINICAL DATA:  Leukocytosis EXAM: CHEST  1 VIEW COMPARISON:  07/16/2018 FINDINGS: Cardiac shadow is within normal limits and stable. Mild aortic calcifications are seen. The lungs are well aerated bilaterally without focal infiltrate or sizable effusion. No acute bony abnormality is seen. IMPRESSION: No acute abnormality noted. Electronically Signed   By: Inez Catalina M.D.   On: 07/18/2018 16:21   ASSESSMENT AND PLAN:   1. Non-ST elevation MI.  Patient was started on heparin drip and initially admitted to the stepdown unit Patient had cardiac catheterization done  recently and was reported to have been negative.   2D echocardiogram done  with moderately depressed left ventricular function with EF of 30 to 35%.  Anterior/apical hypokinesis.  Findings likely due to Takotsubo cardiomyopathy Heparin drip already discontinued.  Continue metoprolol and aspirin and lisinopril. Later notified by  nursing staff that patient had abnormal rhythm with suspicion of V. tach on telemetry.  Cardiology evaluated patient and reviewed the strips and felt to be probable atrial fibrillation with aberrancy.  Recommendation was to restart metoprolol and observe on telemetry till a.m.. We will add low-dose of Lasix 20 mg p.o. daily due to underlying cardiomyopathy and patient appears to have some mild shortness of breath with laying flat.  To follow-up with cardiologist post discharge from the hospital  2.  Hypertension Blood pressure controlled on current regimen.  Blood pressure appears borderline.  If further drop we will hold lisinopril  3. Chronic back pain.  Continue Neurontin from home Stable and controlled  4.  Leukocytosis with low-grade temperature of 99.1 Noted significant improvement in leukocytosis this morning.  Patient remains afebrile. Possibly reactive.  Stat chest x-ray was negative.  Urinalysis done was negative.  So far no growth on blood cultures. Will discontinue to biotics with Zosyn if patient remains afebrile in a.m.  DVT prophylaxis.    Lovenox  CODE STATUS; full code  Management plans discussed with the patient, family and they are in agreement.   TOTAL TIME TAKING CARE OF THIS PATIENT: 35 minutes.   More than 50% of the time was spent in counseling/coordination of care: YES  POSSIBLE D/C IN  1 DAY, DEPENDING ON CLINICAL CONDITION.   Savio Albrecht M.D on 07/19/2018 at 3:14 PM  Between 7am to 6pm - Pager - [phone]  After 6pm go to [email or web address] - Proofreader  [facility]  Office  [phone]  CC: Primary care physician; Glendon Axe, MD  Note: This dictation was prepared with Dragon dictation along with smaller phrase technology. Any transcriptional errors that result from this process are unintentional.

## 2018-07-19 NOTE — Plan of Care (Signed)
Complained of headache once, treated with tylenol once, with relief.  Problem: Activity: Goal: Ability to return to baseline activity level will improve Outcome: Progressing Note:  Up to bathroom with 1 assist.   Problem: Cardiovascular: Goal: Vascular access site(s) Level 0-1 will be maintained Outcome: Progressing Note:  Right radial cath site, level 0, dressing off. Clean dry & intact

## 2018-07-19 NOTE — Progress Notes (Addendum)
Per CCMD(Alicia)  pt had 29 beats of v-tach at 0855. Went to check on pt, pt resting in bed comfortably, no distress noted. Dr. Stark Jock notified. Will continue to monitor.

## 2018-07-19 NOTE — Progress Notes (Signed)
Spoke to Dr. Stark Jock regarding low BP, per MD hold morning bp meds.Will continue to monitor.

## 2018-07-19 NOTE — Care Management Important Message (Signed)
Copy of signed Medicare IM left with patient in room. 

## 2018-07-19 NOTE — Progress Notes (Signed)
Cardiovascular and Pulmonary Nurse Navigator Note:    73 year old female with hx HTN, hiatal hernia, DVT wo presented toe the ER with c/o chest pain.  Troponin max - 7.99.  Patient ruled in for NSTEMI.  Patient underwent cardiac cath yesterday on 07/17/2018    07/17/2018 Panel Physicians Referring Physician Case Authorizing Physician  Isaias Cowman, MD (Primary)  Isaias Cowman, MD  Procedures   Left Heart Cath and Coronary Angiography  Conclusion   1.  Normal coronary anatomy 2.  Mildly reduced left ventricular function with apical wall akinesis consistent with stress-induced cardiomyopathy ( apical ballooning syndrome / Takotsubo cardiomyopathy )  Recommendations  1.  Medical therapy 2.  Start metoprolol succinate 25 mg daily 3.  Start lisinopril 5 mg daily   Echo:   KATHLEE BARNHARDT  ECHO COMPLETE WO IMAGING ENHANCING AGENT  Order# [id number]  Reading physician: Yolonda Kida, MD Ordering physician: Hillary Bow, MD Study date: 07/17/18  ECHOCARDIOGRAM COMPLETE (Accession [id number]) (Order [id number])  Echocardiography  Date: 07/16/2018 Department: [place] [facility] (2A) Released By/Authorizing: Hillary Bow, MD (auto-released)  Linked Results   Procedure Abnormality Status  ECHOCARDIOGRAM COMPLETE    ECHOCARDIOGRAM COMPLETE  Order #: [id number] Accession #: [id number]  Patient Info   Patient name: [name]  MRN: [id number]  Age: 73 y.o.  Sex: female  Vitals   BP Height Weight BSA (Calculated - sq m)  95/62 5\' 5"  (6.314 m) 97.0 kg 2.04 sq meters  Study Result   Result status: Final result   Transthoracic Echocardiography  Patient:    Mary, Cain MR #:       [id number] Study Date: 07/17/2018 Gender:     F Age:        89 Height:     165.1 cm Weight:     88.6 kg BSA:        2.05 m^2 Pt. Status: Room:   ATTENDING    Sudini, Srikar R  ORDERING     Sudini, Srikar R  REFERRING    Sudini, Srikar R  SONOGRAPHER   [place] [facility] RDCS  PERFORMING   [facility]  cc:  ------------------------------------------------------------------- LV EF: 30% -   35%  ------------------------------------------------------------------- Indications:      Acute myocardial infarction 410.  ------------------------------------------------------------------- History:   PMH:  DVT, GERD, history of pneumonia.  Risk factors: Hypertension.  ------------------------------------------------------------------- Study Conclusions  - Procedure narrative: Transthoracic echocardiography for left   ventricular function evaluation, for right ventricular function   evaluation, and for assessment of valvular function. Image   quality was suboptimal. The study was technically difficult. - Left ventricle: The cavity size was mildly dilated. Wall   thickness was increased in a pattern of mild LVH. Systolic   function was moderately to severely reduced. The estimated   ejection fraction was in the range of 30% to 35%. Hypokinesis of   the anteroseptal myocardium. Akinesis of the anteroseptal   myocardium. Hypokinesis of the anterior myocardium. Akinesis of   the anterior myocardium. Hypokinesis of the anterolateral   myocardium. Akinesis of the apical myocardium. Doppler parameters   are consistent with abnormal left ventricular relaxation (grade 1   diastolic dysfunction). - Ventricular septum: The contour showed diastolic flattening.  Impressions:  - Moderately depressed LVF   Ant/Apical Hypo   EF=30-35%   Mild reduced RVF   Mild LVH.   EDUCATION:   Rounded on patient. Patient sitting up on side of bed finishing breakfast.  Husband at bedside  sitting in recliner.  Patient gave verbal permission to speak about her health information in front of her husband.   Discussed elevated troponins - NSTEMI - Takotsubo Cardiomyopathy (Stressed Induced Cardiomyopathy) - and Cardiac Rehab.    Handout on Stressed Induced  Cardiomyopathy given and reviewed with patient.    Informed patient she has been referred to outpatient Cardiac Rehab at [facility].  Overview of the program provided.  Patient does not currently exercise.  Patient informed this RN that she was walking 3 miles two to three times a week in 2016.  Patient informed this RN that she has had multiple back surgeries by Dr. Arnoldo Morale in [place].  Patient is not sure she would be able to participate in Cardiac Rehab due to her back issues and neuropathy.  Patient plans to discuss Cardiac Rehab with Dr. Arnoldo Morale first prior to starting the program.  Information sheet with overview, CPT billing codes, orientation and class times given to patient.    Heart healthy diet reviewed with patient and husband.  Patient is a NEVER smoker.    Patient and husband thanked me for the above information.   Roanna Epley, RN, BSN, [facility]  Cardiovascular & Pulmonary Nurse Navigator  Direct Line: [phone]  Department Phone #: [phone] Fax: [phone]  Email Address: [email or web address]

## 2018-07-20 LAB — CBC
HCT: 40.9 % (ref 36.0–46.0)
HEMOGLOBIN: 12.6 g/dL (ref 12.0–15.0)
MCH: 30 pg (ref 26.0–34.0)
MCHC: 30.8 g/dL (ref 30.0–36.0)
MCV: 97.4 fL (ref 80.0–100.0)
Platelets: 245 10*3/uL (ref 150–400)
RBC: 4.2 MIL/uL (ref 3.87–5.11)
RDW: 13.6 % (ref 11.5–15.5)
WBC: 10 10*3/uL (ref 4.0–10.5)
nRBC: 0 % (ref 0.0–0.2)

## 2018-07-20 LAB — BASIC METABOLIC PANEL
Anion gap: 9 (ref 5–15)
BUN: 16 mg/dL (ref 8–23)
CHLORIDE: 104 mmol/L (ref 98–111)
CO2: 24 mmol/L (ref 22–32)
CREATININE: 0.75 mg/dL (ref 0.44–1.00)
Calcium: 8.2 mg/dL — ABNORMAL LOW (ref 8.9–10.3)
GFR calc Af Amer: >60 mL/min (ref >60)
GFR calc non Af Amer: >60 mL/min (ref >60)
Glucose, Bld: 107 mg/dL — ABNORMAL HIGH (ref 70–99)
Potassium: 3.8 mmol/L (ref 3.5–5.1)
Sodium: 137 mmol/L (ref 135–145)

## 2018-07-20 LAB — MAGNESIUM: Magnesium: 2.1 mg/dL (ref 1.7–2.4)

## 2018-07-20 MED ORDER — POLYETHYLENE GLYCOL 3350 17 G PO PACK: 17.0000 g | PACK | Freq: Every day | ORAL | 0 refills | Status: DC | PRN

## 2018-07-20 MED ORDER — ASPIRIN 81 MG PO TBEC: 81.0000 mg | DELAYED_RELEASE_TABLET | Freq: Every day | ORAL | 0 refills | Status: DC

## 2018-07-20 MED ORDER — FUROSEMIDE 20 MG PO TABS: 20.0000 mg | ORAL_TABLET | Freq: Every day | ORAL | 0 refills | Status: DC

## 2018-07-20 MED ORDER — LISINOPRIL 5 MG PO TABS: 5.0000 mg | ORAL_TABLET | Freq: Every day | ORAL | 0 refills | Status: DC

## 2018-07-20 MED ORDER — METOPROLOL SUCCINATE ER 25 MG PO TB24: 25.0000 mg | ORAL_TABLET | Freq: Every day | ORAL | 0 refills | Status: DC

## 2018-07-20 NOTE — Plan of Care (Signed)
  Problem: Activity: Goal: Ability to return to baseline activity level will improve Outcome: Progressing Note:  Up in room with standby assist   Problem: Cardiovascular: Goal: Vascular access site(s) Level 0-1 will be maintained Outcome: Progressing Note:  Right radial cath site at a level 0, bandage taken off, clean dry & intact

## 2018-07-20 NOTE — Discharge Summary (Signed)
[facility] NAME: [name]    MR#:  [id number]  DATE OF BIRTH:  19-Apr-1946  DATE OF ADMISSION:  07/16/2018   ADMITTING PHYSICIAN: Hillary Bow, MD  DATE OF DISCHARGE: 07/20/2018  PRIMARY CARE PHYSICIAN: Glendon Axe, MD   ADMISSION DIAGNOSIS:  chest pain DISCHARGE DIAGNOSIS:  Active Problems:   NSTEMI (non-ST elevated myocardial infarction) ([place])  SECONDARY DIAGNOSIS:   Past Medical History:  Diagnosis Date  . Back ache 11/26/2014  . Benign essential HTN 11/26/2014  . Bilateral tinnitus 11/26/2014  . Cancer ([place])    basal cell carcinoma on nose  . DDD (degenerative disc disease), cervical 11/26/2014  . 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 DVT (deep venous thrombosis) ([place])   . GERD (gastroesophageal reflux disease)   . Gout   . H/O malignant neoplasm of skin 06/20/2012  . Hemorrhoids   . History of anemia   . History of kidney stones   . History of pneumonia   . Numbness and tingling   . 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 PONV (postoperative nausea and vomiting)    "hard to come out of anesthesia"  . Renal colic 1/44/3154   HOSPITAL COURSE:  Chief complaint; chest pain  HPI [name]  is a 73 y.o. female with a known history of hypertension, GERD, hiatal hernia, DVT presented to the emergency room complaining of acute onset of chest pain radiating to the back.  Patient has chronic back pain which is unchanged.  Also had shortness of breath and felt fatigued.  No palpitations.  Afebrile. Here patient's EKG showed some ST-T wave changes.  Troponin was elevated at 0.2.  Cardiology was called.  Diagnosed with non-ST elevation MI.  Patient was started on heparin drip and admitted to stepdown unit.  Cardiology consultation placed.  Please refer to the H&P dictated for further details.  HOSPITAL COURSE; 1. Non-ST elevation MI.  Patient was initially started on heparin drip and initially admitted to the  stepdown unit. atient had cardiac catheterization done recently and was negative.  2D echocardiogram done with moderately depressed left ventricular function with EF of 30 to 35%.  Anterior/apical hypokinesis.  Patient admitted to being under a lot of stress lately. Findings due to Takotsubo cardiomyopathy. Heparin drip already discontinued.  ContinueD metoprolol and aspirin and lisinopril.  Started on Lasix 20 mg p.o. daily.  During the course of this admission patient was reported to have an abnormal rhythm on telemetry.  Case discussed with cardiologist Dr. Saralyn Pilar, [place] who reviewed the strips and felt to be probable atrial fibrillation with aberrancy.  Recommendation was to continue metoprolol and observe.  No further events reported on telemetry in the last 24 hours.  Patient remains asymptomatic.  Patient cleared for discharge by cardiologist.  Clinically and hemodynamically stable.  Appointment made to follow-up with cardiologist in 1 week.    2.  Hypertension Blood pressure controlled on current regimen.  Continue low-dose lisinopril and metoprolol.  Follow-up with primary care physician   3. Chronic back pain. Stable.  Continue home regimen  4.  Leukocytosis  Completely resolved.  Felt to be reactive.  White blood cell count down to normal.  Patient has remained afebrile.  Was placed empirically on antibiotics which was discontinued on discharge.  No evidence of infectious process found. Patient clinically and hemodynamically stable and wishes to be discharged home today.  Follow-up with primary care physician.  CODE STATUS; full code   DISCHARGE  CONDITIONS:  Stable CONSULTS OBTAINED:  Treatment Team:  Isaias Cowman, MD Hermelinda Dellen, DO DRUG ALLERGIES:   Allergies  Allergen Reactions  . Bee Venom Anaphylaxis  . Iodinated Diagnostic Agents Anaphylaxis  . Iodine Anaphylaxis  . Clarithromycin Other (See Comments)    Generalized weakness Heart feels funny  .  Codeine Nausea Only   DISCHARGE MEDICATIONS:   Allergies as of 07/20/2018      Reactions   Bee Venom Anaphylaxis   Iodinated Diagnostic Agents Anaphylaxis   Iodine Anaphylaxis   Clarithromycin Other (See Comments)   Generalized weakness Heart feels funny   Codeine Nausea Only      Medication List    STOP taking these medications   amLODipine-benazepril 5-10 MG capsule Commonly known as:  LOTREL   benazepril 20 MG tablet Commonly known as:  LOTENSIN   hydrochlorothiazide 25 MG tablet Commonly known as:  HYDRODIURIL   triamterene-hydrochlorothiazide 37.5-25 MG tablet Commonly known as:  MAXZIDE-25     TAKE these medications   aspirin 81 MG EC tablet Take 1 tablet (81 mg total) by mouth daily. Start taking on:  July 21, 2018   Biotin 1 MG Caps Take 1 mg by mouth daily.   colchicine 0.6 MG tablet Take 0.6 mg by mouth daily as needed (gout).   cyclobenzaprine 10 MG tablet Commonly known as:  FLEXERIL Take 1 tablet (10 mg total) by mouth 3 (three) times daily as needed for muscle spasms. What changed:  when to take this   docusate sodium 100 MG capsule Commonly known as:  COLACE Take 1 capsule (100 mg total) by mouth 2 (two) times daily.   famotidine 10 MG tablet Commonly known as:  PEPCID Take 10 mg by mouth as needed for heartburn or indigestion.   FIBER PO Take 1 tablet by mouth daily.   Flaxseed Oil 1000 MG Caps Take 1,000 mg by mouth daily.   furosemide 20 MG tablet Commonly known as:  LASIX Take 1 tablet (20 mg total) by mouth daily. Start taking on:  July 21, 2018 What changed:    when to take this  reasons to take this   gabapentin 400 MG capsule Commonly known as:  NEURONTIN Take 400-800 mg by mouth 3 (three) times daily. 400 in the morning, 400 in the afternoon and 800mg  at bedtime   Garlic 7169 MG Caps Take 1,000 mg by mouth daily.   HYDROMET 5-1.5 MG/5ML syrup Generic drug:  HYDROcodone-homatropine TK 5 ML PO Q 6 H PRN     lisinopril 5 MG tablet Commonly known as:  PRINIVIL,ZESTRIL Take 1 tablet (5 mg total) by mouth daily. Start taking on:  July 21, 2018   metoprolol succinate 25 MG 24 hr tablet Commonly known as:  TOPROL-XL Take 1 tablet (25 mg total) by mouth daily. Start taking on:  July 21, 2018   MULTI-VITAMINS Tabs Take 1 tablet by mouth daily.   Omega-3 1000 MG Caps Take 1,000 mg by mouth daily.   FISH OIL OMEGA-3 PO Take by mouth.   oxyCODONE-acetaminophen 10-325 MG tablet Commonly known as:  PERCOCET Take 1 tablet by mouth every 4 (four) hours as needed for pain.   polyethylene glycol packet Commonly known as:  MIRALAX / GLYCOLAX Take 17 g by mouth daily as needed for mild constipation.   psyllium 58.6 % powder Commonly known as:  METAMUCIL Take 1 packet by mouth daily. What changed:  Another medication with the same name was removed. Continue taking this medication, and follow  the directions you see here.   SYSTANE ULTRA OP Apply 1 drop to eye as needed (dry eyes).   VITAMIN D-1000 MAX ST 25 MCG (1000 UT) tablet Generic drug:  Cholecalciferol Take 2,000 Units by mouth daily.        DISCHARGE INSTRUCTIONS:   DIET:  Cardiac diet DISCHARGE CONDITION:  Stable ACTIVITY:  Activity as tolerated OXYGEN:  Home Oxygen: No.  Oxygen Delivery: room air DISCHARGE LOCATION:  home   If you experience worsening of your admission symptoms, develop shortness of breath, life threatening emergency, suicidal or homicidal thoughts you must seek medical attention immediately by calling 911 or calling your MD immediately  if symptoms less severe.  You Must read complete instructions/literature along with all the possible adverse reactions/side effects for all the Medicines you take and that have been prescribed to you. Take any new Medicines after you have completely understood and accpet all the possible adverse reactions/side effects.   Please note  You were cared for by a  hospitalist during your hospital stay. If you have any questions about your discharge medications or the care you received while you were in the hospital after you are discharged, you can call the unit and asked to speak with the hospitalist on call if the hospitalist that took care of you is not available. Once you are discharged, your primary care physician will handle any further medical issues. Please note that NO REFILLS for any discharge medications will be authorized once you are discharged, as it is imperative that you return to your primary care physician (or establish a relationship with a primary care physician if you do not have one) for your aftercare needs so that they can reassess your need for medications and monitor your lab values.    On the day of Discharge:  VITAL SIGNS:  Blood pressure 109/62, pulse 84, temperature (!) 97.5 F (36.4 C), temperature source Oral, resp. rate 20, height 5\' 5"  (1.651 m), weight 89 kg, SpO2 100 %. PHYSICAL EXAMINATION:  GENERAL:  73 y.o.-year-old patient lying in the bed with no acute distress.  EYES: Pupils equal, round, reactive to light and accommodation. No scleral icterus. Extraocular muscles intact.  HEENT: Head atraumatic, normocephalic. Oropharynx and nasopharynx clear.  NECK:  Supple, no jugular venous distention. No thyroid enlargement, no tenderness.  LUNGS: Normal breath sounds bilaterally, no wheezing, rales,rhonchi or crepitation. No use of accessory muscles of respiration.  CARDIOVASCULAR: S1, S2 normal. No murmurs, rubs, or gallops.  ABDOMEN: Soft, non-tender, non-distended. Bowel sounds present. No organomegaly or mass.  EXTREMITIES: No pedal edema, cyanosis, or clubbing.  NEUROLOGIC: Cranial nerves II through XII are intact. Muscle strength 5/5 in all extremities. Sensation intact. Gait not checked.  PSYCHIATRIC: The patient is alert and oriented x 3.  SKIN: No obvious rash, lesion, or ulcer.  DATA REVIEW:   CBC Recent Labs    Lab 07/20/18 0643  WBC 10.0  HGB 12.6  HCT 40.9  PLT 245    Chemistries  Recent Labs  Lab 07/20/18 0643  NA 137  K 3.8  CL 104  CO2 24  GLUCOSE 107*  BUN 16  CREATININE 0.75  CALCIUM 8.2*  MG 2.1     Microbiology Results  Results for orders placed or performed during the hospital encounter of 07/16/18  MRSA PCR Screening     Status: None   Collection Time: 07/16/18  3:45 PM  Result Value Ref Range Status   MRSA by PCR NEGATIVE NEGATIVE Final  Comment:        The GeneXpert MRSA Assay (FDA approved for NASAL specimens only), is one component of a comprehensive MRSA colonization surveillance program. It is not intended to diagnose MRSA infection nor to guide or monitor treatment for MRSA infections. Performed at [facility], [address] [place], [place] [address]   CULTURE, BLOOD (ROUTINE X 2) w Reflex to ID Panel     Status: None (Preliminary result)   Collection Time: 07/18/18  4:16 PM  Result Value Ref Range Status   Specimen Description BLOOD BLOOD RIGHT ARM  Final   Special Requests   Final    BOTTLES DRAWN AEROBIC AND ANAEROBIC Blood Culture adequate volume   Culture   Final    NO GROWTH 2 DAYS Performed at [facility], [address], [place], [place] [address]    Report Status PENDING  Incomplete  CULTURE, BLOOD (ROUTINE X 2) w Reflex to ID Panel     Status: None (Preliminary result)   Collection Time: 07/18/18  4:16 PM  Result Value Ref Range Status   Specimen Description BLOOD BLOOD LEFT ARM  Final   Special Requests   Final    BOTTLES DRAWN AEROBIC AND ANAEROBIC Blood Culture results may not be optimal due to an inadequate volume of blood received in culture bottles   Culture   Final    NO GROWTH 2 DAYS Performed at [facility], [address], [place], [place] [address]    Report Status PENDING  Incomplete    RADIOLOGY:  No results found.   Management plans discussed with the patient, family and  they are in agreement.  CODE STATUS: Full Code   TOTAL TIME TAKING CARE OF THIS PATIENT: 36 minutes.    Reyes Aldaco M.D on 07/20/2018 at 10:19 AM  Between 7am to 6pm - Pager - [phone]  After 6pm go to [email or web address] - Proofreader  [facility]  Office  [phone]  CC: Primary care physician; Glendon Axe, MD   Note: This dictation was prepared with Dragon dictation along with smaller phrase technology. Any transcriptional errors that result from this process are unintentional.

## 2018-07-20 NOTE — Progress Notes (Signed)
Patient discharged to home with spouse.  Tele and IV d/c'd prior to discharge.  Patient verbalizes understanding of what to look for at radial cath site with signs and symptoms of infection.  Verbalizes understanding of discharge instructions.  States has all belongings.

## 2018-07-23 LAB — CULTURE, BLOOD (ROUTINE X 2)
Culture: NO GROWTH
Culture: NO GROWTH
Special Requests: ADEQUATE

## 2018-11-07 ENCOUNTER — Other Ambulatory Visit: Payer: Self-pay | Admitting: Internal Medicine

## 2018-11-07 ENCOUNTER — Ambulatory Visit
Admission: RE | Admit: 2018-11-07 | Discharge: 2018-11-07 | Disposition: A | Discharge status: Home / Self Care | Payer: Medicare Other | Source: Ambulatory Visit | Attending: Internal Medicine | Admitting: Internal Medicine

## 2018-11-07 ENCOUNTER — Other Ambulatory Visit: Payer: Self-pay

## 2018-11-07 DIAGNOSIS — M79661 Pain in right lower leg: Secondary | ICD-10-CM

## 2018-11-07 DIAGNOSIS — M7989 Other specified soft tissue disorders: Secondary | ICD-10-CM | POA: Diagnosis present

## 2018-11-08 ENCOUNTER — Other Ambulatory Visit: Payer: Self-pay | Admitting: Internal Medicine

## 2018-11-08 DIAGNOSIS — Z1231 Encounter for screening mammogram for malignant neoplasm of breast: Secondary | ICD-10-CM

## 2018-12-15 ENCOUNTER — Other Ambulatory Visit: Payer: Self-pay

## 2018-12-15 ENCOUNTER — Ambulatory Visit
Admission: RE | Admit: 2018-12-15 | Discharge: 2018-12-15 | Disposition: A | Discharge status: Home / Self Care | Payer: Medicare Other | Source: Ambulatory Visit | Attending: Internal Medicine | Admitting: Internal Medicine

## 2018-12-15 DIAGNOSIS — Z1231 Encounter for screening mammogram for malignant neoplasm of breast: Secondary | ICD-10-CM | POA: Diagnosis present

## 2019-01-08 ENCOUNTER — Other Ambulatory Visit: Payer: Self-pay | Admitting: Physician Assistant

## 2019-01-08 DIAGNOSIS — G8929 Other chronic pain: Secondary | ICD-10-CM

## 2019-01-08 DIAGNOSIS — M25461 Effusion, right knee: Secondary | ICD-10-CM

## 2019-01-21 ENCOUNTER — Ambulatory Visit
Admission: RE | Admit: 2019-01-21 | Discharge: 2019-01-21 | Disposition: A | Discharge status: Home / Self Care | Payer: Medicare Other | Source: Ambulatory Visit | Attending: Physician Assistant | Admitting: Physician Assistant

## 2019-01-21 DIAGNOSIS — M25561 Pain in right knee: Secondary | ICD-10-CM | POA: Diagnosis not present

## 2019-01-21 DIAGNOSIS — M25461 Effusion, right knee: Secondary | ICD-10-CM | POA: Diagnosis present

## 2019-01-21 DIAGNOSIS — G8929 Other chronic pain: Secondary | ICD-10-CM | POA: Insufficient documentation

## 2019-10-03 ENCOUNTER — Other Ambulatory Visit: Payer: Self-pay | Admitting: Internal Medicine

## 2019-10-03 DIAGNOSIS — R519 Headache, unspecified: Secondary | ICD-10-CM

## 2019-10-09 ENCOUNTER — Ambulatory Visit
Admission: RE | Admit: 2019-10-09 | Discharge: 2019-10-09 | Disposition: A | Discharge status: Home / Self Care | Payer: Medicare Other | Source: Ambulatory Visit | Attending: Internal Medicine | Admitting: Internal Medicine

## 2019-10-09 ENCOUNTER — Other Ambulatory Visit: Payer: Self-pay

## 2019-10-09 DIAGNOSIS — R519 Headache, unspecified: Secondary | ICD-10-CM | POA: Insufficient documentation

## 2019-10-22 ENCOUNTER — Other Ambulatory Visit: Payer: Self-pay

## 2019-10-22 ENCOUNTER — Ambulatory Visit (INDEPENDENT_AMBULATORY_CARE_PROVIDER_SITE_OTHER): Payer: Medicare Other | Admitting: Dermatology

## 2019-10-22 DIAGNOSIS — L239 Allergic contact dermatitis, unspecified cause: Secondary | ICD-10-CM

## 2019-10-22 DIAGNOSIS — L281 Prurigo nodularis: Secondary | ICD-10-CM

## 2019-10-22 MED ORDER — CLOBETASOL PROPIONATE 0.05 % EX CREA: 1.0000 "application " | TOPICAL_CREAM | Freq: Two times a day (BID) | CUTANEOUS | 0 refills | Status: DC

## 2019-10-22 NOTE — Progress Notes (Signed)
   Follow-Up Visit   Subjective  [name] is a 74 y.o. female who presents for the following: Rash (R ant thigh ~3wks, itchy and painful, used neosporin on it).  Also has bump on neck that she picks at, won't heal.   The following portions of the chart were reviewed this encounter and updated as appropriate:     Review of Systems: No other skin or systemic complaints.  Objective  Well appearing patient in no apparent distress; mood and affect are within normal limits.  A focused examination was performed including R leg, L neck. Relevant physical exam findings are noted in the Assessment and Plan.  Objective  R ant thigh: Rectangular scaly bright erythematous patch   Objective  L neck: Pink excoriated pap  Assessment & Plan  Allergic contact dermatitis, unspecified trigger R ant thigh  Due to Neosporin vrs adhesive of bandage.  Start Clobetasol cr bid until clear.  D/C Neosporin D/C bandage  clobetasol cream (TEMOVATE) 0.05 % - R ant thigh  Prurigo nodularis L neck  Start clobetasol cr bid to aa for 2 weeks, avoid f/g/a Avoid picking.  Call if no improvement  Return for as scheduled 01/01/20 at 10:30.   I, [name], RMA, am acting as scribe for [name], MD .

## 2019-11-09 ENCOUNTER — Other Ambulatory Visit: Payer: Self-pay | Admitting: Internal Medicine

## 2019-11-09 DIAGNOSIS — Z1231 Encounter for screening mammogram for malignant neoplasm of breast: Secondary | ICD-10-CM

## 2020-01-01 ENCOUNTER — Ambulatory Visit (INDEPENDENT_AMBULATORY_CARE_PROVIDER_SITE_OTHER): Payer: Medicare Other | Admitting: Dermatology

## 2020-01-01 ENCOUNTER — Other Ambulatory Visit: Payer: Self-pay

## 2020-01-01 DIAGNOSIS — L700 Acne vulgaris: Secondary | ICD-10-CM | POA: Diagnosis not present

## 2020-01-01 DIAGNOSIS — L819 Disorder of pigmentation, unspecified: Secondary | ICD-10-CM

## 2020-01-01 DIAGNOSIS — L818 Other specified disorders of pigmentation: Secondary | ICD-10-CM | POA: Diagnosis not present

## 2020-01-01 DIAGNOSIS — L578 Other skin changes due to chronic exposure to nonionizing radiation: Secondary | ICD-10-CM | POA: Diagnosis not present

## 2020-01-01 DIAGNOSIS — Z85828 Personal history of other malignant neoplasm of skin: Secondary | ICD-10-CM

## 2020-01-01 MED ORDER — CLINDAMYCIN PHOSPHATE 1 % EX LOTN: TOPICAL_LOTION | CUTANEOUS | 1 refills | Status: DC

## 2020-01-01 MED ORDER — DOXYCYCLINE HYCLATE 100 MG PO CAPS: 100.0000 mg | ORAL_CAPSULE | Freq: Two times a day (BID) | ORAL | 0 refills | Status: AC

## 2020-01-01 NOTE — Patient Instructions (Signed)
Clindamycin lotion - Apply to affected areas on face twice a day as needed for breakouts. Doxycycline - Take 1 pill twice a day for 2 weeks.  Doxycycline should be taken with food to prevent nausea. Do not lay down for 30 minutes after taking. Be cautious with sun exposure and use good sun protection while on this medication. Pregnant women should not take this medication.

## 2020-01-01 NOTE — Progress Notes (Signed)
   Follow-Up Visit   Subjective  [name] is a 74 y.o. female who presents for the following: 6 mo f/u (Hx AKs, BCCs) and breaking out (face, worse with mask).  Has dark patch on thigh she wants checked.   The following portions of the chart were reviewed this encounter and updated as appropriate:      Review of Systems:  No other skin or systemic complaints except as noted in HPI or Assessment and Plan.  Objective  Well appearing patient in no apparent distress; mood and affect are within normal limits.  A focused examination was performed including face, arms. Relevant physical exam findings are noted in the Assessment and Plan.  Objective  cheeks, forehead, forearm: Excoriated inflamed pink papules on cheeks, forehead, right forearm  Objective  Right Anterior Thigh: Pink brown patch, no scale, no symptoms at this time   Assessment & Plan  Acne vulgaris cheeks, forehead, forearm  Vrs folliculitis, likely exacerbated by mask Start clindamycin lotion Apply to affected areas face BID dsp 60mL 1Rf Start doxycycline 100mg  take 1 po BID x 2 weeks dsp #28 0Rf Avoid picking  Doxycycline should be taken with food to prevent nausea. Do not lay down for 30 minutes after taking. Be cautious with sun exposure and use good sun protection while on this medication. Pregnant women should not take this medication.    clindamycin (CLEOCIN-T) 1 % lotion - cheeks, forehead, forearm  doxycycline (VIBRAMYCIN) 100 MG capsule - cheeks, forehead, forearm  Disorder of pigmentation Right Anterior Thigh  Post-Inflammatory Pigment Alteration Benign, observe.  Advised patient will fade over time.  Actinic Damage - diffuse scaly erythematous macules with underlying dyspigmentation - Recommend daily broad spectrum sunscreen SPF 30+ to sun-exposed areas, reapply every 2 hours as needed.  - Call for new or changing lesions.  History of Basal Cell Carcinoma of the Skin - No evidence of  recurrence today- nose clear - Recommend regular full body skin exams - Recommend daily broad spectrum sunscreen SPF 30+ to sun-exposed areas, reapply every 2 hours as needed.  - Call if any new or changing lesions are noted between office visits   Return in about 6 months (around 07/02/2020) for TBSE.   IJamesetta Orleans, CMA, am acting as scribe for [name], MD .  Documentation: I have reviewed the above documentation for accuracy and completeness, and I agree with the above.  [name] MD

## 2020-01-20 ENCOUNTER — Emergency Department
Admission: EM | Admit: 2020-01-20 | Discharge: 2020-01-20 | Disposition: A | Discharge status: Home / Self Care | Payer: Medicare Other | Attending: Emergency Medicine | Admitting: Emergency Medicine

## 2020-01-20 ENCOUNTER — Other Ambulatory Visit: Payer: Self-pay

## 2020-01-20 ENCOUNTER — Emergency Department: Payer: Medicare Other

## 2020-01-20 ENCOUNTER — Encounter: Payer: Self-pay | Admitting: Emergency Medicine

## 2020-01-20 DIAGNOSIS — Z85828 Personal history of other malignant neoplasm of skin: Secondary | ICD-10-CM | POA: Insufficient documentation

## 2020-01-20 DIAGNOSIS — Z86718 Personal history of other venous thrombosis and embolism: Secondary | ICD-10-CM | POA: Insufficient documentation

## 2020-01-20 DIAGNOSIS — Z79899 Other long term (current) drug therapy: Secondary | ICD-10-CM | POA: Diagnosis not present

## 2020-01-20 DIAGNOSIS — R42 Dizziness and giddiness: Secondary | ICD-10-CM | POA: Insufficient documentation

## 2020-01-20 DIAGNOSIS — Z7982 Long term (current) use of aspirin: Secondary | ICD-10-CM | POA: Insufficient documentation

## 2020-01-20 DIAGNOSIS — I10 Essential (primary) hypertension: Secondary | ICD-10-CM | POA: Diagnosis not present

## 2020-01-20 LAB — COMPREHENSIVE METABOLIC PANEL
ALT: 20 U/L (ref 0–44)
AST: 20 U/L (ref 15–41)
Albumin: 4.4 g/dL (ref 3.5–5.0)
Alkaline Phosphatase: 55 U/L (ref 38–126)
Anion gap: 13 (ref 5–15)
BUN: 21 mg/dL (ref 8–23)
CO2: 22 mmol/L (ref 22–32)
Calcium: 9.5 mg/dL (ref 8.9–10.3)
Chloride: 103 mmol/L (ref 98–111)
Creatinine, Ser: 0.84 mg/dL (ref 0.44–1.00)
GFR calc Af Amer: >60 mL/min (ref >60)
GFR calc non Af Amer: >60 mL/min (ref >60)
Glucose, Bld: 144 mg/dL — ABNORMAL HIGH (ref 70–99)
Potassium: 3.8 mmol/L (ref 3.5–5.1)
Sodium: 138 mmol/L (ref 135–145)
Total Bilirubin: 0.8 mg/dL (ref 0.3–1.2)
Total Protein: 7.5 g/dL (ref 6.5–8.1)

## 2020-01-20 LAB — CBC
HCT: 42.3 % (ref 36.0–46.0)
Hemoglobin: 13.6 g/dL (ref 12.0–15.0)
MCH: 30 pg (ref 26.0–34.0)
MCHC: 32.2 g/dL (ref 30.0–36.0)
MCV: 93.4 fL (ref 80.0–100.0)
Platelets: 324 10*3/uL (ref 150–400)
RBC: 4.53 MIL/uL (ref 3.87–5.11)
RDW: 13.1 % (ref 11.5–15.5)
WBC: 11 10*3/uL — ABNORMAL HIGH (ref 4.0–10.5)
nRBC: 0 % (ref 0.0–0.2)

## 2020-01-20 LAB — URINALYSIS, COMPLETE (UACMP) WITH MICROSCOPIC
Bilirubin Urine: NEGATIVE
Glucose, UA: NEGATIVE mg/dL
Hgb urine dipstick: NEGATIVE
Ketones, ur: 5 mg/dL — AB
Leukocytes,Ua: NEGATIVE
Nitrite: NEGATIVE
Protein, ur: NEGATIVE mg/dL
Specific Gravity, Urine: 1.025 (ref 1.005–1.030)
pH: 5 (ref 5.0–8.0)

## 2020-01-20 LAB — TROPONIN I (HIGH SENSITIVITY): Troponin I (High Sensitivity): 4 ng/L (ref <18)

## 2020-01-20 LAB — LIPASE, BLOOD: Lipase: 23 U/L (ref 11–51)

## 2020-01-20 MED ORDER — DIAZEPAM 2 MG PO TABS
2.0000 mg | ORAL_TABLET | Freq: Two times a day (BID) | ORAL | 0 refills | Status: AC | PRN
Start: 2020-01-20 — End: 2021-01-19

## 2020-01-20 MED ORDER — MECLIZINE HCL 25 MG PO TABS: 25.0000 mg | ORAL_TABLET | Freq: Three times a day (TID) | ORAL | 0 refills | Status: DC | PRN

## 2020-01-20 MED ORDER — ONDANSETRON HCL 4 MG/2ML IJ SOLN
4.0000 mg | Freq: Once | INTRAMUSCULAR | Status: AC
  Administered 2020-01-20: 4 mg via INTRAVENOUS
  Filled 2020-01-20: qty 2

## 2020-01-20 MED ORDER — DIAZEPAM 2 MG PO TABS: 2.0000 mg | ORAL_TABLET | Freq: Two times a day (BID) | ORAL | 0 refills | Status: DC | PRN

## 2020-01-20 MED ORDER — SODIUM CHLORIDE 0.9 % IV SOLN: Freq: Once | INTRAVENOUS | Status: AC

## 2020-01-20 MED ORDER — SODIUM CHLORIDE 0.9% FLUSH: 3.0000 mL | Freq: Once | INTRAVENOUS | Status: DC

## 2020-01-20 MED ORDER — MECLIZINE HCL 25 MG PO TABS
25.0000 mg | ORAL_TABLET | Freq: Once | ORAL | Status: AC
  Administered 2020-01-20: 25 mg via ORAL
  Filled 2020-01-20: qty 1

## 2020-01-20 MED ORDER — LORAZEPAM 2 MG/ML IJ SOLN
1.0000 mg | Freq: Once | INTRAMUSCULAR | Status: AC
  Administered 2020-01-20: 1 mg via INTRAVENOUS
  Filled 2020-01-20: qty 1

## 2020-01-20 MED ORDER — ONDANSETRON 4 MG PO TBDP
4.0000 mg | ORAL_TABLET | Freq: Once | ORAL | Status: AC | PRN
  Administered 2020-01-20: 4 mg via ORAL
  Filled 2020-01-20: qty 1

## 2020-01-20 NOTE — ED Notes (Signed)
MRI to come get patient at this time.

## 2020-01-20 NOTE — ED Triage Notes (Signed)
Pt to ED via POV c/o nausea and vomiting that started this morning. Pt states that she has vomited 3 times this morning. Pt denies fevers or chills. Pt is in NAD.
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
## 2020-01-20 NOTE — ED Notes (Signed)
Pt states that she is not able to give urine sample at this time. Pt was given a cup and told that we needed to sample when she is able to give one.

## 2020-01-20 NOTE — Discharge Instructions (Signed)
Use the Antivert 1 pill 3 times a day and the Valium 1 pill twice a day.  Be careful it can most make you woozy.  Do not fall.  Do not drive while taking getting them.  Return for any worsening vertigo or vomiting follow-up with Dr. Pryor Ochoa ENT, call his office Monday morning to schedule an appointment in a week or so.

## 2020-05-16 ENCOUNTER — Other Ambulatory Visit: Payer: Self-pay | Admitting: Neurology

## 2020-05-16 ENCOUNTER — Other Ambulatory Visit (HOSPITAL_COMMUNITY): Payer: Self-pay | Admitting: Neurology

## 2020-05-16 DIAGNOSIS — E538 Deficiency of other specified B group vitamins: Secondary | ICD-10-CM

## 2020-07-14 ENCOUNTER — Encounter: Payer: Medicare Other | Admitting: Dermatology

## 2020-07-15 ENCOUNTER — Ambulatory Visit (HOSPITAL_COMMUNITY)
Admission: RE | Admit: 2020-07-15 | Discharge: 2020-07-15 | Disposition: A | Discharge status: Home / Self Care | Payer: Medicare Other | Source: Ambulatory Visit | Attending: Neurology | Admitting: Neurology

## 2020-07-15 ENCOUNTER — Other Ambulatory Visit: Payer: Self-pay

## 2020-07-15 DIAGNOSIS — E538 Deficiency of other specified B group vitamins: Secondary | ICD-10-CM | POA: Diagnosis present

## 2020-07-15 MED ORDER — GADOBUTROL 1 MMOL/ML IV SOLN
9.0000 mL | Freq: Once | INTRAVENOUS | Status: AC | PRN
  Administered 2020-07-15: 9 mL via INTRAVENOUS

## 2020-07-23 ENCOUNTER — Other Ambulatory Visit: Payer: Self-pay

## 2020-07-23 ENCOUNTER — Ambulatory Visit (INDEPENDENT_AMBULATORY_CARE_PROVIDER_SITE_OTHER): Payer: Medicare Other | Admitting: Dermatology

## 2020-07-23 ENCOUNTER — Encounter: Payer: Self-pay | Admitting: Dermatology

## 2020-07-23 DIAGNOSIS — L700 Acne vulgaris: Secondary | ICD-10-CM | POA: Diagnosis not present

## 2020-07-23 DIAGNOSIS — I8310 Varicose veins of unspecified lower extremity with inflammation: Secondary | ICD-10-CM

## 2020-07-23 DIAGNOSIS — Z85828 Personal history of other malignant neoplasm of skin: Secondary | ICD-10-CM | POA: Diagnosis not present

## 2020-07-23 DIAGNOSIS — L578 Other skin changes due to chronic exposure to nonionizing radiation: Secondary | ICD-10-CM

## 2020-07-23 DIAGNOSIS — Z1283 Encounter for screening for malignant neoplasm of skin: Secondary | ICD-10-CM

## 2020-07-23 DIAGNOSIS — L821 Other seborrheic keratosis: Secondary | ICD-10-CM

## 2020-07-23 MED ORDER — CLINDAMYCIN PHOSPHATE 1 % EX LOTN: TOPICAL_LOTION | CUTANEOUS | 3 refills | Status: DC

## 2020-07-23 NOTE — Progress Notes (Signed)
   Follow-Up Visit   Subjective  [name] is a 75 y.o. female who presents for the following: Annual Exam (Total body skin exam, hx of BCC), varicies (Bil ankles/feet, legs, painful), and Acne (Face, Clindamycin lotion prn, not taking doxycycline).  Clindamycin lotion helps.  Patient accompanied by husband who contributes to history.  Pt has Alzheimer's per husband.  The following portions of the chart were reviewed this encounter and updated as appropriate:       Review of Systems:  No other skin or systemic complaints except as noted in HPI or Assessment and Plan.  Objective  Well appearing patient in no apparent distress; mood and affect are within normal limits.  A full examination was performed including scalp, head, eyes, ears, nose, lips, neck, chest, axillae, abdomen, back, buttocks, bilateral upper extremities, bilateral lower extremities, hands, feet, fingers, toes, fingernails, and toenails. All findings within normal limits unless otherwise noted below.  Objective  L dorsal nose, L nasal tip, R medial eyebrow, R paranasal above nasal ala: Well healed scars with no evidence of recurrence.   Objective  face: Pink excoriated paps perioral   Assessment & Plan   Skin cancer screening performed today.  Actinic Damage - chronic, secondary to cumulative UV radiation exposure/sun exposure over time - diffuse scaly erythematous macules with underlying dyspigmentation - Recommend daily broad spectrum sunscreen SPF 30+ to sun-exposed areas, reapply every 2 hours as needed.  - Call for new or changing lesions. - face  Varicose Veins - Dilated blue, purple or red veins at the lower extremities - Reassured - These can be treated by sclerotherapy (a procedure to inject a medicine into the veins to make them disappear) if desired, but the treatment is not covered by insurance - Recommend support socks/compression hose qd, info on [place] given to patient. - bil  legs/feet  History of basal cell carcinoma (BCC) L dorsal nose, L nasal tip, R medial eyebrow, R paranasal above nasal ala  Clear. Observe for recurrence. Call clinic for new or changing lesions.  Recommend regular skin exams, daily broad-spectrum spf 30+ sunscreen use, and photoprotection.     Acne vulgaris face  Chronic condition, Continued flares  Restart Clindamycin lotion qhs D/c Doxycycline for now, may restart if topical tx not effective.  Reordered Medications clindamycin (CLEOCIN-T) 1 % lotion   Seborrheic Keratoses - Stuck-on, waxy, tan-brown papules and plaques  - Discussed benign etiology and prognosis. - Observe - Call for any changes  Return in about 1 year (around 07/23/2021) for TBSE, Hx of BCC.  I, [name], RMA, am acting as scribe for Mary Patty, MD . Documentation: I have reviewed the above documentation for accuracy and completeness, and I agree with the above.  Mary Patty MD

## 2020-07-23 NOTE — Patient Instructions (Signed)
Acne face  Restart Clindamycin lotion nightly to face

## 2020-08-27 ENCOUNTER — Other Ambulatory Visit: Payer: Self-pay

## 2020-08-27 ENCOUNTER — Ambulatory Visit (INDEPENDENT_AMBULATORY_CARE_PROVIDER_SITE_OTHER): Payer: Medicare Other | Admitting: Dermatology

## 2020-08-27 DIAGNOSIS — L309 Dermatitis, unspecified: Secondary | ICD-10-CM | POA: Diagnosis not present

## 2020-08-27 MED ORDER — HYDROCORTISONE 2.5 % EX CREA: TOPICAL_CREAM | CUTANEOUS | 1 refills | Status: DC

## 2020-08-27 MED ORDER — TRIAMCINOLONE ACETONIDE 0.1 % EX CREA: TOPICAL_CREAM | CUTANEOUS | 1 refills | Status: DC

## 2020-08-27 NOTE — Patient Instructions (Addendum)
Hydrocortisone 2.5% cream - apply to affected areas rash on face and neck twice a day until improved. Triamcinolone 0.1% cream - apply to affected areas rash on arms twice a day until improved. Avoid face, groin, underarms. Use medicines up to a month.  Topical steroids (such as triamcinolone, fluocinolone, fluocinonide, mometasone, clobetasol, halobetasol, betamethasone, hydrocortisone) can cause thinning and lightening of the skin if they are used for too long in the same area. Your physician has selected the right strength medicine for your problem and area affected on the body. Please use your medication only as directed by your physician to prevent side effects.    Dry Skin Care  What causes dry skin?  Dry skin is common and results from inadequate moisture in the outer skin layers. Dry skin usually results from the excessive loss of moisture from the skin surface. This occurs due to two major factors: 1. Normally the skin's oil glands deposit a layer of oil on the skin's surface. This layer of oil prevents the loss of moisture from the skin. Exposure to soaps, cleaners, solvents, and disinfectants removes this oily film, allowing water to escape. 2. Water loss from the skin increases when the humidity is low. During winter months we spend a lot of time indoors where the air is heated. Heated air has very low humidity. This also contributes to dry skin.  A tendency for dry skin may accompany such disorders as eczema. Also, as people age, the number of functioning oil glands decreases, and the tendency toward dry skin can be a sensation of skin tightness when emerging from the shower.  How do I manage dry skin?  1. Humidify your environment. This can be accomplished by using a humidifier in your bedroom at night during winter months. 2. Bathing can actually put moisture back into your skin if done right. Take the following steps while bathing to sooth dry skin:  Avoid hot water, which only  dries the skin and makes itching worse. Use warm water.  Avoid washcloths or extensive rubbing or scrubbing.  Use mild soaps like unscented Dove, Oil of Olay, Cetaphil, Basis, or CeraVe.  If you take baths rather than showers, rinse off soap residue with clean water before getting out of tub.  Once out of the shower/tub, pat dry gently with a soft towel. Leave your skin damp.  While still damp, apply any medicated ointment/cream you were prescribed to the affected areas. After you apply your medicated ointment/cream, then apply your moisturizer to your whole body.This is the most important step in dry skin care. If this is omitted, your skin will continue to be dry.  The choice of moisturizer is also very important. In general, lotion will not provider enough moisture to severely dry skin because it is water based. You should use an ointment or cream. Moisturizers should also be unscented. Good choices include Vaseline (plain petrolatum), Aquaphor, Cetaphil, CeraVe, Vanicream, DML Forte, Aveeno moisture, or Eucerin Cream.  Bath oils can be helpful, but do not replace the application of moisturizer after the bath. In addition, they make the tub slippery causing an increased risk for falls. Therefore, we do not recommend their use.

## 2020-08-27 NOTE — Progress Notes (Signed)
   Follow-Up Visit   Subjective  [name] is a 75 y.o. female who presents for the following: Skin Problem.  Patient here today for breaking out on the face and arms for one week. She is itchy and not able to sleep at night. She is using [place] moisturizer. She uses clindamycin lotion on her face for acne.  The following portions of the chart were reviewed this encounter and updated as appropriate:       Review of Systems:  No other skin or systemic complaints except as noted in HPI or Assessment and Plan.  Objective  Well appearing patient in no apparent distress; mood and affect are within normal limits.  A focused examination was performed including face, arms. Relevant physical exam findings are noted in the Assessment and Plan.  Objective  face, arms, neck: Pink excoriated papules on cheeks, neck, forearms; pink scaly patch on antecubital, bil.   Assessment & Plan  Dermatitis face, arms, neck  With flare Start TMC 0.1% Cream apply to AAs arms BID until rash improved.  Start HC 2.5% Cream Apply to AAs face and neck BID until rash improved.  Use medicines up to 1 month.  Recommend mild soap and moisturizing cream 1-2 times daily.  Sample of Aveeno Balm and Aveeno Restorative Skin Therapy.  Topical steroids (such as triamcinolone, fluocinolone, fluocinonide, mometasone, clobetasol, halobetasol, betamethasone, hydrocortisone) can cause thinning and lightening of the skin if they are used for too long in the same area. Your physician has selected the right strength medicine for your problem and area affected on the body. Please use your medication only as directed by your physician to prevent side effects.     triamcinolone (KENALOG) 0.1 % - face, arms, neck  hydrocortisone 2.5 % cream - face, arms, neck  Return in about 1 month (around 09/24/2020) for eczema.   IJamesetta Orleans, CMA, am acting as scribe for [name], MD .  Documentation: I have reviewed the above  documentation for accuracy and completeness, and I agree with the above.  [name] MD

## 2020-09-15 ENCOUNTER — Other Ambulatory Visit: Payer: Self-pay | Admitting: Internal Medicine

## 2020-09-15 DIAGNOSIS — Z1231 Encounter for screening mammogram for malignant neoplasm of breast: Secondary | ICD-10-CM

## 2020-09-24 ENCOUNTER — Other Ambulatory Visit: Payer: Self-pay

## 2020-09-24 ENCOUNTER — Ambulatory Visit (INDEPENDENT_AMBULATORY_CARE_PROVIDER_SITE_OTHER): Payer: Medicare Other | Admitting: Dermatology

## 2020-09-24 DIAGNOSIS — L700 Acne vulgaris: Secondary | ICD-10-CM

## 2020-09-24 DIAGNOSIS — L309 Dermatitis, unspecified: Secondary | ICD-10-CM | POA: Diagnosis not present

## 2020-09-24 DIAGNOSIS — I8311 Varicose veins of right lower extremity with inflammation: Secondary | ICD-10-CM

## 2020-09-24 DIAGNOSIS — I8312 Varicose veins of left lower extremity with inflammation: Secondary | ICD-10-CM

## 2020-09-24 NOTE — Patient Instructions (Addendum)
Clindamycin lotion - apply to face daily or as needed for acne.  Decrease triamcinolone cream and hydrocortisone 2.5% cream - use 1-2 times a day as needed for itch/rash.  Topical steroids (such as triamcinolone, fluocinolone, fluocinonide, mometasone, clobetasol, halobetasol, betamethasone, hydrocortisone) can cause thinning and lightening of the skin if they are used for too long in the same area. Your physician has selected the right strength medicine for your problem and area affected on the body. Please use your medication only as directed by your physician to prevent side effects.

## 2020-09-24 NOTE — Progress Notes (Signed)
   Follow-Up Visit   Subjective  [name] is a 75 y.o. female who presents for the following: Follow-up (Patient here for 1 month follow-up dermatitis of the arms, face, neck. Rash is overall improved using TMC 0.1% Cream as needed to the arms and HC 2.5% to the face and neck. Itch is improved.).   The following portions of the chart were reviewed this encounter and updated as appropriate:       Review of Systems:  No other skin or systemic complaints except as noted in HPI or Assessment and Plan.  Objective  Well appearing patient in no apparent distress; mood and affect are within normal limits.  A focused examination was performed including face, arms. Relevant physical exam findings are noted in the Assessment and Plan.  Objective  forearms, face, neck: Pink macules c/w healed excoriations.  Objective  face: Crusted inflammatory papule L jaw   Assessment & Plan   Dermatitis forearms, face, neck  Much improved Decrease TMC 0.1% Cream and use qd/bid prn itch/rash. Decrease HC 2.5% cream and use qd/bid prn itch/rash.  Recommend mild soap and moisturizing cream 1-2 times daily.    Other Related Medications triamcinolone (KENALOG) 0.1 % hydrocortisone 2.5 % cream  Acne vulgaris face  Continue clindamycin lotion qd/bid prn.  Other Related Medications clindamycin (CLEOCIN-T) 1 % lotion  Varicose Veins - Dilated blue, purple or red veins at the lower extremities - Reassured, start compression hose daily to help discomfort - These can be treated by sclerotherapy (a procedure to inject a medicine into the veins to make them disappear) if desired, but the treatment is not covered by insurance.  Can seek treatment at [facility] if continued symptoms  Return as scheduled.   IJamesetta Orleans, CMA, am acting as scribe for [name], MD .  Documentation: I have reviewed the above documentation for accuracy and completeness, and I agree with the  above.  [name] MD

## 2020-10-09 ENCOUNTER — Other Ambulatory Visit: Payer: Self-pay

## 2020-10-09 ENCOUNTER — Ambulatory Visit
Admission: RE | Admit: 2020-10-09 | Discharge: 2020-10-09 | Disposition: A | Discharge status: Home / Self Care | Payer: Medicare Other | Source: Ambulatory Visit | Attending: Internal Medicine | Admitting: Internal Medicine

## 2020-10-09 DIAGNOSIS — Z1231 Encounter for screening mammogram for malignant neoplasm of breast: Secondary | ICD-10-CM | POA: Insufficient documentation

## 2020-12-29 ENCOUNTER — Other Ambulatory Visit: Payer: Self-pay

## 2020-12-29 ENCOUNTER — Emergency Department
Admission: EM | Admit: 2020-12-29 | Discharge: 2020-12-29 | Disposition: A | Discharge status: Home / Self Care | Payer: Medicare Other | Attending: Emergency Medicine | Admitting: Emergency Medicine

## 2020-12-29 ENCOUNTER — Encounter: Payer: Self-pay | Admitting: Emergency Medicine

## 2020-12-29 ENCOUNTER — Emergency Department: Payer: Medicare Other

## 2020-12-29 DIAGNOSIS — Z7982 Long term (current) use of aspirin: Secondary | ICD-10-CM | POA: Insufficient documentation

## 2020-12-29 DIAGNOSIS — I10 Essential (primary) hypertension: Secondary | ICD-10-CM | POA: Diagnosis not present

## 2020-12-29 DIAGNOSIS — N390 Urinary tract infection, site not specified: Secondary | ICD-10-CM | POA: Diagnosis not present

## 2020-12-29 DIAGNOSIS — Z79899 Other long term (current) drug therapy: Secondary | ICD-10-CM | POA: Diagnosis not present

## 2020-12-29 DIAGNOSIS — R300 Dysuria: Secondary | ICD-10-CM | POA: Diagnosis not present

## 2020-12-29 DIAGNOSIS — Z85828 Personal history of other malignant neoplasm of skin: Secondary | ICD-10-CM | POA: Diagnosis not present

## 2020-12-29 DIAGNOSIS — R1031 Right lower quadrant pain: Secondary | ICD-10-CM | POA: Diagnosis present

## 2020-12-29 LAB — BASIC METABOLIC PANEL
Anion gap: 7 (ref 5–15)
BUN: 20 mg/dL (ref 8–23)
CO2: 28 mmol/L (ref 22–32)
Calcium: 9.1 mg/dL (ref 8.9–10.3)
Chloride: 104 mmol/L (ref 98–111)
Creatinine, Ser: 0.76 mg/dL (ref 0.44–1.00)
GFR, Estimated: >60 mL/min (ref >60)
Glucose, Bld: 98 mg/dL (ref 70–99)
Potassium: 3.8 mmol/L (ref 3.5–5.1)
Sodium: 139 mmol/L (ref 135–145)

## 2020-12-29 LAB — URINALYSIS, COMPLETE (UACMP) WITH MICROSCOPIC
Bilirubin Urine: NEGATIVE
Glucose, UA: NEGATIVE mg/dL
Ketones, ur: NEGATIVE mg/dL
Nitrite: NEGATIVE
Protein, ur: NEGATIVE mg/dL
Specific Gravity, Urine: 1.021 (ref 1.005–1.030)
WBC, UA: >50 WBC/hpf — ABNORMAL HIGH (ref 0–5)
pH: 5 (ref 5.0–8.0)

## 2020-12-29 LAB — CBC
HCT: 41.8 % (ref 36.0–46.0)
Hemoglobin: 13.7 g/dL (ref 12.0–15.0)
MCH: 31.2 pg (ref 26.0–34.0)
MCHC: 32.8 g/dL (ref 30.0–36.0)
MCV: 95.2 fL (ref 80.0–100.0)
Platelets: 319 10*3/uL (ref 150–400)
RBC: 4.39 MIL/uL (ref 3.87–5.11)
RDW: 13.3 % (ref 11.5–15.5)
WBC: 10.9 10*3/uL — ABNORMAL HIGH (ref 4.0–10.5)
nRBC: 0 % (ref 0.0–0.2)

## 2020-12-29 MED ORDER — NITROFURANTOIN MONOHYD MACRO 100 MG PO CAPS
100.0000 mg | ORAL_CAPSULE | Freq: Once | ORAL | Status: AC
  Administered 2020-12-29: 100 mg via ORAL
  Filled 2020-12-29: qty 1

## 2020-12-29 MED ORDER — NITROFURANTOIN MONOHYD MACRO 100 MG PO CAPS: 100.0000 mg | ORAL_CAPSULE | Freq: Two times a day (BID) | ORAL | 0 refills | Status: AC

## 2020-12-29 NOTE — ED Triage Notes (Signed)
Pt comes into the ED via POV c/o LLQ pain and flank pain as well as dysuria.  Pt states she has had these symptoms intermittently since last fall and now they are progressively getting worse.  PT in NAD at this time with even and unlabored respirations and is ambulatory to triage.

## 2020-12-29 NOTE — ED Provider Notes (Signed)
[facility] Emergency Department Provider Note   ____________________________________________    I have reviewed the triage vital signs and the nursing notes.   HISTORY  Chief Complaint Flank Pain and Dysuria     HPI [name] is a 75 y.o. female who presents with complaints of right lower quadrant/flank pain.  She reports this is been happening for some time but occurred today.  The pain was sharp in nature and seems to have improved now.  She also reports it was difficult to urinate earlier today.  No reports of fevers or chills.  No vomiting.  Has not take anything for this.  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   DVT (deep venous thrombosis) (HCC)    GERD (gastroesophageal reflux disease)    Gout    H/O malignant neoplasm of skin 06/20/2012   Hemorrhoids    History of anemia    History of kidney stones    History of pneumonia    Numbness and tingling    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   PONV (postoperative nausea and vomiting)    "hard to come out of anesthesia"   Renal colic 2/72/5366    Patient Active Problem List   Diagnosis Date Noted   NSTEMI (non-ST elevated myocardial infarction) ([place]) 07/16/2018   Varicose veins of leg with pain, right 09/06/2017   Lumbar stenosis with neurogenic claudication 44/09/4740   Renal colic 59/56/3875   Back ache 11/26/2014   Benign essential HTN 11/26/2014   DDD (degenerative disc disease), cervical 11/26/2014   DDD (degenerative  disc disease), lumbosacral 11/26/2014   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   Bilateral tinnitus 11/26/2014   Detachment, retinal, 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   H/O malignant neoplasm of skin 06/20/2012    Past Surgical History:  Procedure Laterality Date   BREAST EXCISIONAL BIOPSY Right 2012   CHOLECYSTECTOMY     COLONOSCOPY WITH PROPOFOL N/A 12/26/2017   Procedure: COLONOSCOPY WITH PROPOFOL;  Surgeon: Manya Silvas, MD;  Location: [facility] ENDOSCOPY;  Service: Endoscopy;  Laterality: N/A;   CYSTOSCOPY/RETROGRADE/URETEROSCOPY     ESOPHAGOGASTRODUODENOSCOPY  12/26/2017   Procedure: ESOPHAGOGASTRODUODENOSCOPY (EGD);  Surgeon: Manya Silvas, MD;  Location: [facility] ENDOSCOPY;  Service: Endoscopy;;   EYE SURGERY Left    KNEE SURGERY Left    LEFT HEART CATH N/A 07/17/2018   Procedure: Left Heart Cath and Coronary Angiography;  Surgeon: Isaias Cowman, MD;  Location: [place] CV LAB;  Service: Cardiovascular;  Laterality: N/A;   lower back surgery     NECK SURGERY     x4   RETINAL DETACHMENT SURGERY Left    SKIN CANCER EXCISION     nose   TONSILLECTOMY     TUBAL LIGATION     WISDOM TOOTH EXTRACTION      Prior to Admission medications   Medication Sig Start Date End Date Taking? Authorizing Provider  nitrofurantoin, macrocrystal-monohydrate, (MACROBID) 100 MG capsule Take 1 capsule (100 mg total) by mouth 2 (two) times daily  for 7 days. 12/29/20 01/05/21 Yes Lavonia Drafts, MD  aspirin EC 81 MG EC tablet Take 1 tablet (81 mg total) by mouth daily. 07/21/18   Stark Jock Jude, MD  Biotin 1 MG CAPS Take 1 mg by mouth daily.    [provider]  Cholecalciferol (VITAMIN D-1000 MAX ST) 1000 UNITS tablet Take 2,000 Units by mouth daily.     [provider]  clindamycin (CLEOCIN-T) 1 % lotion Apply to affected areas face qhs for acne 07/23/20   [name], MD  clobetasol cream (TEMOVATE) 7.59 % Apply 1 application topically 2 (two) times daily. Bid to aa rash on leg until clear,  avoid face, groin, axilla 10/22/19   [name], MD  colchicine 0.6 MG tablet Take 0.6 mg by mouth daily as needed (gout).    [provider]  cyclobenzaprine (FLEXERIL) 10 MG tablet Take 1 tablet (10 mg total) by mouth 3 (three) times daily as needed for muscle spasms. Patient taking differently: Take 10 mg by mouth 2 (two) times daily as needed for muscle spasms.  06/13/15   Newman Pies, MD  diazepam (VALIUM) 2 MG tablet Take 1 tablet (2 mg total) by mouth every 12 (twelve) hours as needed (vertigo). Start tonight before bed.  Be careful can make you woozy.  Do not fall going to the bathroom.  Do not drive 1/63/84 6/65/99  Carrie Mew, MD  docusate sodium (COLACE) 100 MG capsule Take 1 capsule (100 mg total) by mouth 2 (two) times daily. 06/13/15   Newman Pies, MD  famotidine (PEPCID) 10 MG tablet Take 10 mg by mouth as needed for heartburn or indigestion.    [provider]  FIBER PO Take 1 tablet by mouth daily.    [provider]  Flaxseed, Linseed, (FLAXSEED OIL) 1000 MG CAPS Take 1,000 mg by mouth daily.     [provider]  furosemide (LASIX) 20 MG tablet Take 1 tablet (20 mg total) by mouth daily. 07/21/18   Stark Jock Jude, MD  gabapentin (NEURONTIN) 400 MG capsule Take 400-800 mg by mouth 3 (three) times daily. 400 in the morning, 400 in the afternoon and 800mg  at bedtime 02/26/15   [provider]  Garlic 3570 MG CAPS Take 1,000 mg by mouth daily.     [provider]  hydrocortisone 2.5 % cream Apply to affected areas rash on face and neck twice a day until improved. 08/27/20   [name], MD  HYDROMET 5-1.5 MG/5ML syrup TK 5 ML PO Q 6 H PRN 07/23/17   [provider]  lisinopril (PRINIVIL,ZESTRIL) 5 MG tablet Take 1 tablet (5 mg total) by mouth daily. 07/21/18   Stark Jock Jude, MD  meclizine (ANTIVERT) 25 MG tablet Take 1 tablet (25 mg total) by mouth 3 (three) times daily as needed for dizziness. 01/20/20   Carrie Mew, MD  metoprolol succinate (TOPROL-XL) 25 MG 24 hr tablet Take 1 tablet (25 mg total) by mouth daily. 07/21/18   Stark Jock Jude, MD  Multiple Vitamin (MULTI-VITAMINS) TABS Take 1 tablet by mouth daily.     [provider]  Omega-3 1000 MG CAPS Take 1,000 mg by mouth daily.     [provider]  Omega-3 Fatty Acids (FISH OIL OMEGA-3 PO) Take by mouth.    [provider]  oxyCODONE-acetaminophen (PERCOCET) 10-325 MG tablet Take 1 tablet by mouth every 4 (four) hours as needed for pain. Patient not taking: Reported on 09/06/2017 06/13/15   Newman Pies, MD  Polyethyl  Glycol-Propyl Glycol (SYSTANE ULTRA OP) Apply 1 drop to eye as needed (dry eyes).    [provider]  polyethylene glycol (MIRALAX / GLYCOLAX) packet Take 17 g by mouth daily as needed for mild constipation. 07/20/18   Ojie, Jude, MD  psyllium (METAMUCIL) 58.6 % powder Take 1 packet by mouth daily.    [provider]  triamcinolone (KENALOG) 0.1 % Apply to affected areas rash on arms twice a day until improved. Avoid face, groin, underarms. 08/27/20   [name], MD     Allergies Bee venom, Iodinated diagnostic agents, Iodine, Ciprofloxacin, Clarithromycin, and Codeine  Family History  Problem Relation Age of Onset   Breast cancer Paternal Aunt    Breast cancer Cousin     Social History Social History   Tobacco Use   Smoking status: Never   Smokeless tobacco: Never  Substance Use Topics   Alcohol use: No    Alcohol/week: 0.0 standard drinks   Drug use: No    Review of Systems  Constitutional: No fever/chills Eyes: No visual changes.  ENT: No sore throat. Cardiovascular: Denies chest pain. Respiratory: Denies shortness of breath. Gastrointestinal: As above Genitourinary: As above Musculoskeletal: Negative for back pain. Skin: Negative for rash. Neurological: Negative for headaches or weakness   ____________________________________________   PHYSICAL  EXAM:  VITAL SIGNS: ED Triage Vitals  Enc Vitals Group     BP 12/29/20 1626 (!) 156/76     Pulse Rate 12/29/20 1626 79     Resp 12/29/20 1626 17     Temp 12/29/20 1626 98.1 F (36.7 C)     Temp Source 12/29/20 1626 Oral     SpO2 12/29/20 1626 100 %     Weight 12/29/20 1627 90.7 kg (200 lb)     Height 12/29/20 1627 1.651 m (5\' 5" )     Head Circumference --      Peak Flow --      Pain Score 12/29/20 1627 9     Pain Loc --      Pain Edu? --      Excl. in [place]? --     Constitutional: Alert No acute distress. Pleasant and interactive Eyes: Conjunctivae are normal.  Head: Atraumatic.  Cardiovascular: Normal rate, regular rhythm.   Good peripheral circulation. Respiratory: Normal respiratory effort.  No retractions.  Gastrointestinal: Soft and nontender. No distention.  No CVA tenderness. Genitourinary: deferred Musculoskeletal: No lower extremity tenderness nor edema.  Warm and well perfused Neurologic:  Normal speech and language. No gross focal neurologic deficits are appreciated.  Skin:  Skin is warm, dry and intact. No rash noted. Psychiatric: Mood and affect are normal. Speech and behavior are normal.  ____________________________________________   LABS (all labs ordered are listed, but only abnormal results are displayed)  Labs Reviewed  URINALYSIS, COMPLETE (UACMP) WITH MICROSCOPIC - Abnormal; Notable for the following components:      Result Value   Color, Urine YELLOW (*)    APPearance HAZY (*)    Hgb urine dipstick MODERATE (*)    Leukocytes,Ua SMALL (*)    WBC, UA >50 (*)    Bacteria, UA RARE (*)    All other components within normal limits  CBC - Abnormal; Notable for the following components:   WBC 10.9 (*)    All other components within normal limits  URINE CULTURE  BASIC METABOLIC PANEL   ____________________________________________  EKG  None ____________________________________________  RADIOLOGY CT renal stone study reviewed by me, no acute  abnormality ____________________________________________  PROCEDURES  Procedure(s) performed: No  Procedures   Critical Care performed: No ____________________________________________   INITIAL IMPRESSION / ASSESSMENT AND PLAN / ED COURSE  Pertinent labs & imaging results that were available during my care of the patient were reviewed by me and considered in my medical decision making (see chart for details).   Patient presents with right lower quadrant pain that was brief seems to have improved now, also describes some urinary discomfort.  Lab work demonstrates mildly elevated white blood cell count which is nonspecific, pending urinalysis.  Will send for CT renal stone study   CT scan is reassuring.  Urinalysis is consistent with urinary tract infection, will treat with Macrobid, appropriate for discharge with outpatient follow-up.    ____________________________________________   FINAL CLINICAL IMPRESSION(S) / ED DIAGNOSES  Final diagnoses:  Lower urinary tract infectious disease        Note:  This document was prepared using Dragon voice recognition software and may include unintentional dictation errors.    Lavonia Drafts, MD 12/29/20 [phone]

## 2021-01-01 LAB — URINE CULTURE: Culture: >=100000 — AB

## 2021-04-10 IMAGING — MG MM DIGITAL SCREENING BILAT W/ TOMO AND CAD
8 series · 8 of 24 positions shown · non-contrast
Comparison: Previous exam(s).

CLINICAL DATA: Screening.

EXAM:
DIGITAL SCREENING BILATERAL MAMMOGRAM WITH TOMOSYNTHESIS AND CAD
TECHNIQUE: Bilateral screening digital craniocaudal and mediolateral oblique
mammograms were obtained. Bilateral screening digital breast
tomosynthesis was performed. The images were evaluated with
computer-aided detection.

[R MLO synth-2D]
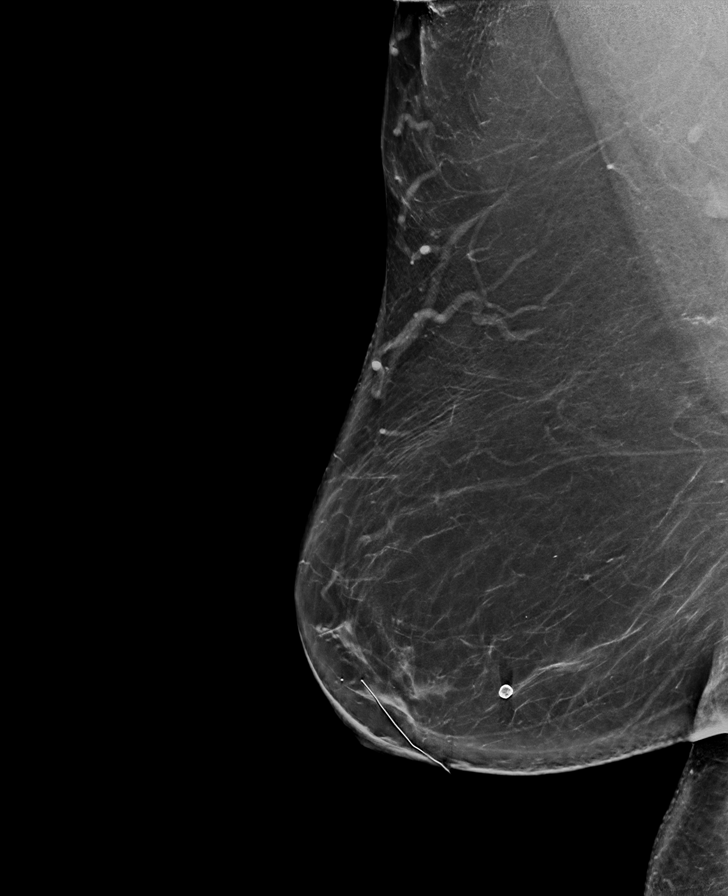

[L MLO synth-2D]
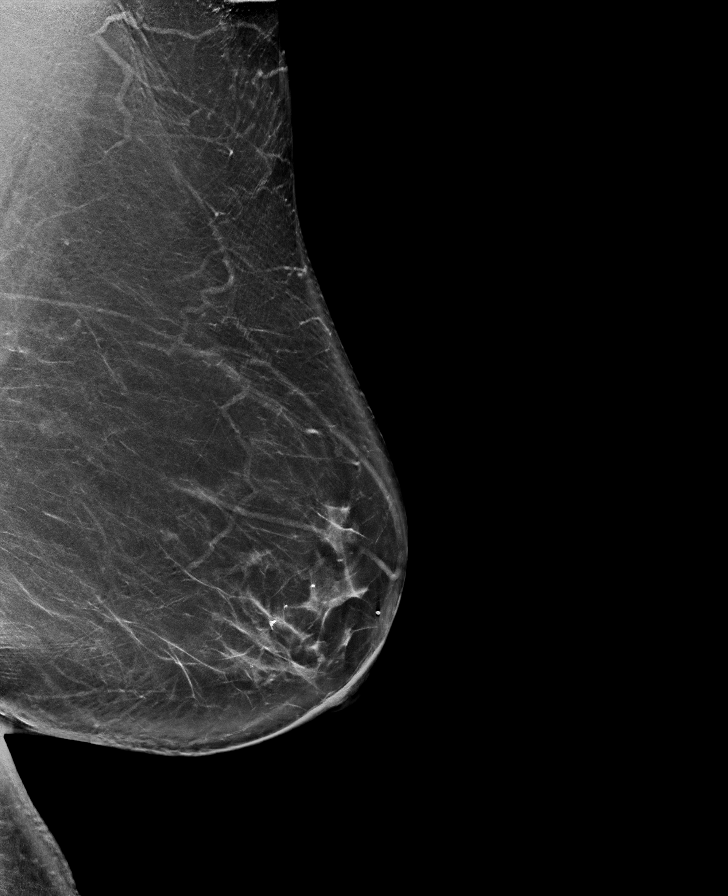

[L CC synth-2D]
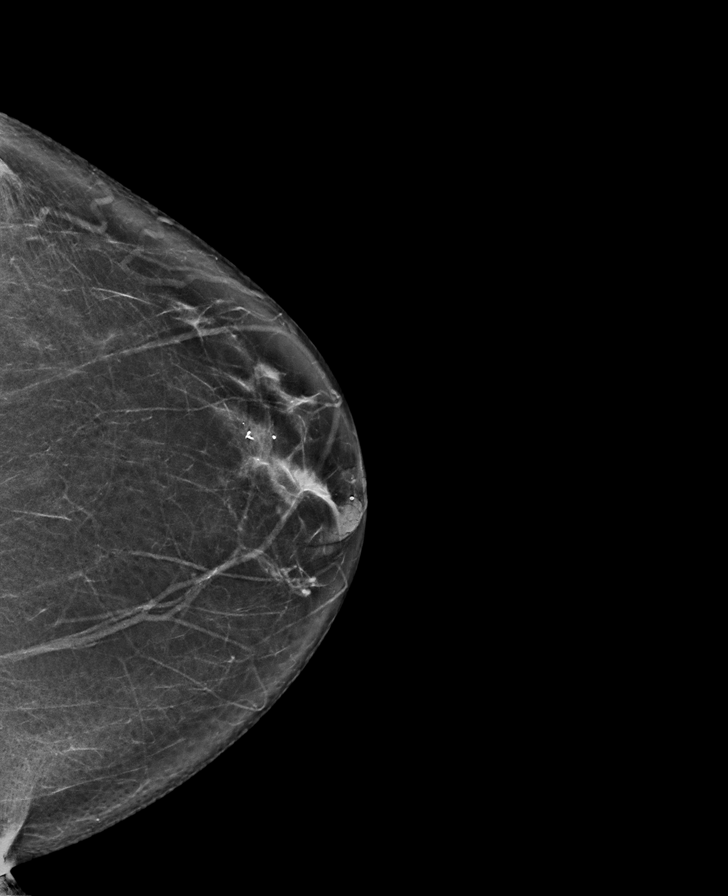

[R CC synth-2D]
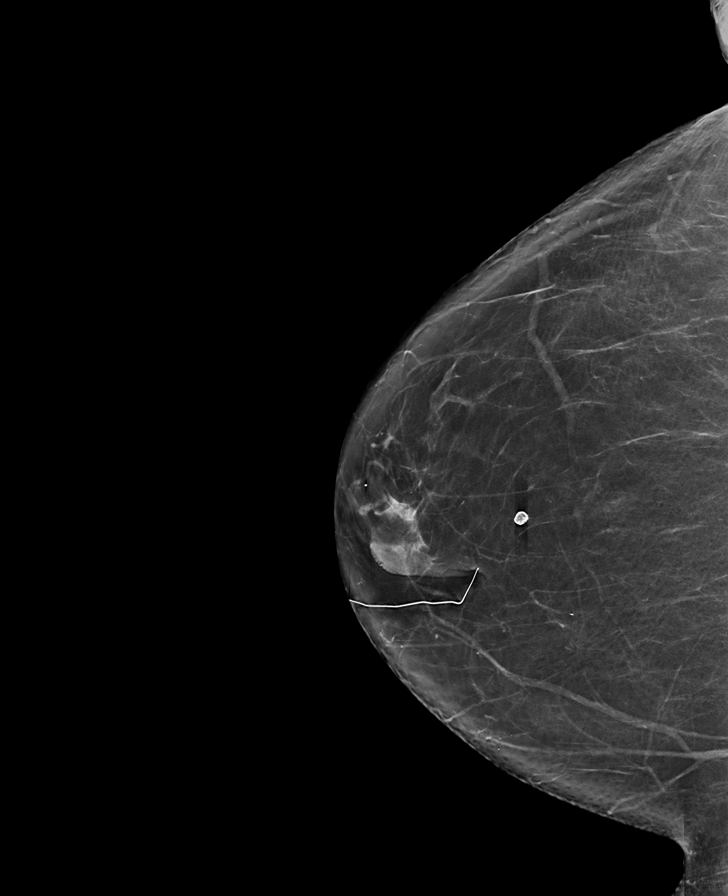

[R CC tomo · tomo slice 37/74.0]
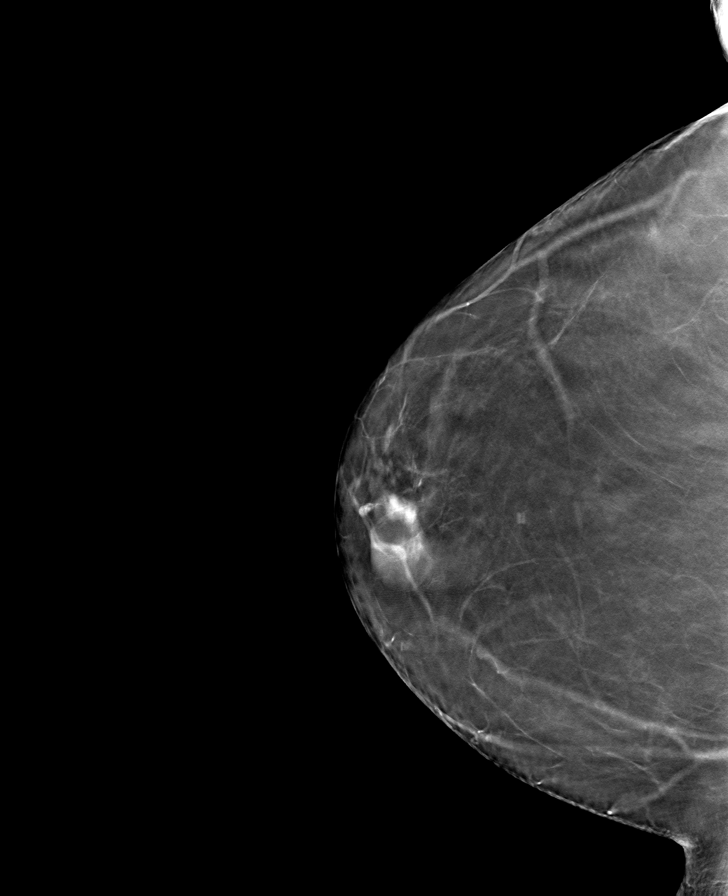

[R MLO tomo · tomo slice 45/88.0]
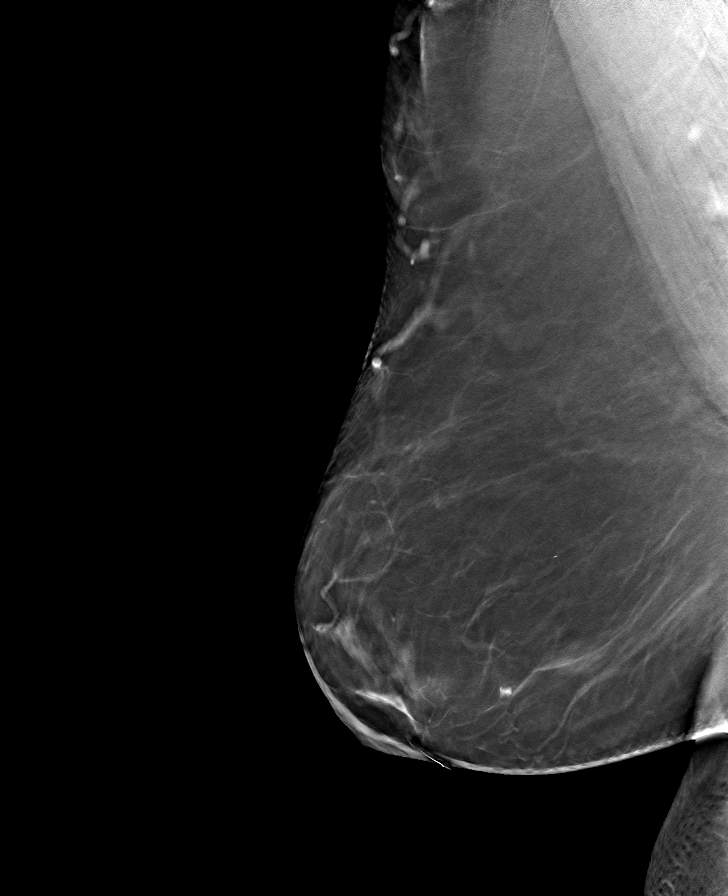

[L CC tomo · tomo slice 37/73.0]
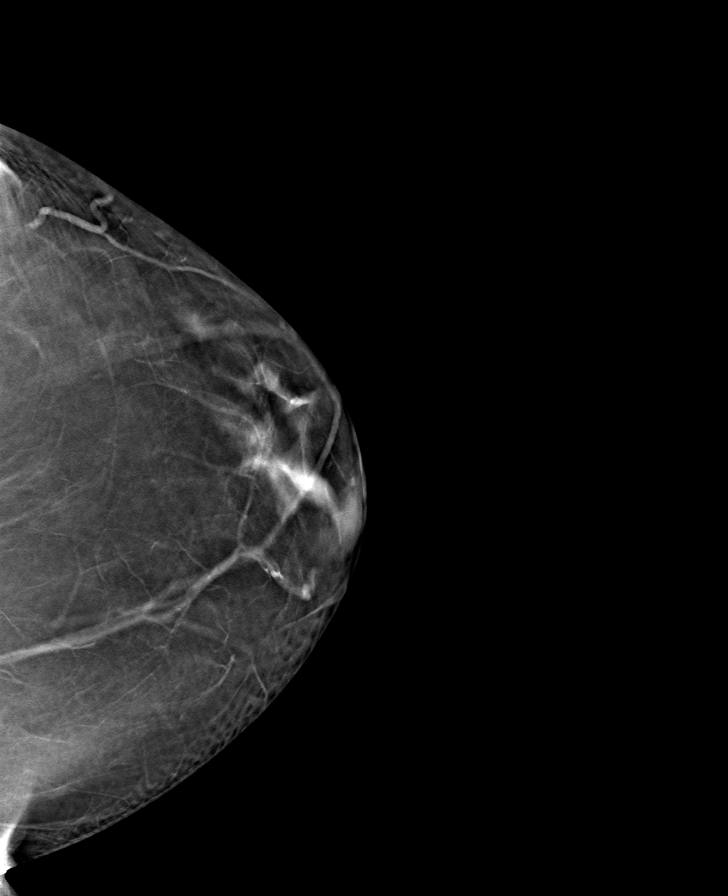

[L MLO tomo · tomo slice 46/91.0]
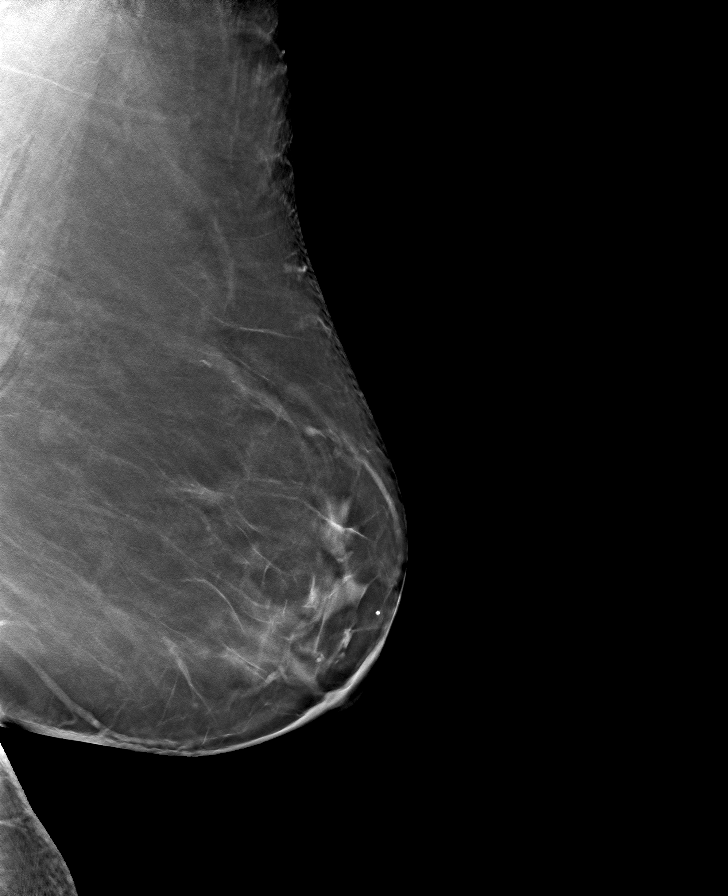

[8 of 24 positions shown; findings below may reference images not displayed]

ACR Breast Density Category b: There are scattered areas of
fibroglandular density.
FINDINGS: There are no findings suspicious for malignancy. The images were
evaluated with computer-aided detection.
IMPRESSION: No mammographic evidence of malignancy. A result letter of this
screening mammogram will be mailed directly to the patient.

RECOMMENDATION:
Screening mammogram in one year. (Code:WJ-I-BG6)

BI-RADS CATEGORY  1: Negative.

## 2021-07-28 ENCOUNTER — Encounter: Payer: Self-pay | Admitting: Dermatology

## 2021-07-28 ENCOUNTER — Ambulatory Visit (INDEPENDENT_AMBULATORY_CARE_PROVIDER_SITE_OTHER): Payer: Medicare Other | Admitting: Dermatology

## 2021-07-28 ENCOUNTER — Other Ambulatory Visit: Payer: Self-pay

## 2021-07-28 DIAGNOSIS — L820 Inflamed seborrheic keratosis: Secondary | ICD-10-CM

## 2021-07-28 DIAGNOSIS — T148XXA Other injury of unspecified body region, initial encounter: Secondary | ICD-10-CM

## 2021-07-28 DIAGNOSIS — Z1283 Encounter for screening for malignant neoplasm of skin: Secondary | ICD-10-CM

## 2021-07-28 DIAGNOSIS — L578 Other skin changes due to chronic exposure to nonionizing radiation: Secondary | ICD-10-CM

## 2021-07-28 DIAGNOSIS — S20412A Abrasion of left back wall of thorax, initial encounter: Secondary | ICD-10-CM

## 2021-07-28 DIAGNOSIS — L813 Cafe au lait spots: Secondary | ICD-10-CM

## 2021-07-28 DIAGNOSIS — I8393 Asymptomatic varicose veins of bilateral lower extremities: Secondary | ICD-10-CM

## 2021-07-28 DIAGNOSIS — Z85828 Personal history of other malignant neoplasm of skin: Secondary | ICD-10-CM

## 2021-07-28 DIAGNOSIS — L821 Other seborrheic keratosis: Secondary | ICD-10-CM

## 2021-07-28 DIAGNOSIS — L814 Other melanin hyperpigmentation: Secondary | ICD-10-CM

## 2021-07-28 NOTE — Progress Notes (Signed)
Follow-Up Visit   Subjective  [name] is a 76 y.o. female who presents for the following: Annual Exam.  Patient presents for TBSE. She has a history of BCCs. The patient has spots, moles and lesions to be evaluated, some may be new or changing. She has a few spots on her right forearm that she picks at. They are irritated, not sure what they are from, possible old bug bites.  The following portions of the chart were reviewed this encounter and updated as appropriate:       Review of Systems:  No other skin or systemic complaints except as noted in HPI or Assessment and Plan.  Objective  Well appearing patient in no apparent distress; mood and affect are within normal limits.  A full examination was performed including scalp, head, eyes, ears, nose, lips, neck, chest, axillae, abdomen, back, buttocks, bilateral upper extremities, bilateral lower extremities, hands, feet, fingers, toes, fingernails, and toenails. All findings within normal limits unless otherwise noted below.  Left Upper Back Linear pink crusted macule  Right Mid Forearm (3) Firm pink brown scaly papules x 3    Assessment & Plan  Skin cancer screening performed today.  Actinic Damage - chronic, secondary to cumulative UV radiation exposure/sun exposure over time - diffuse scaly erythematous macules with underlying dyspigmentation - Recommend daily broad spectrum sunscreen SPF 30+ to sun-exposed areas, reapply every 2 hours as needed.  - Recommend staying in the shade or wearing long sleeves, sun glasses (UVA+UVB protection) and wide brim hats (4-inch brim around the entire circumference of the hat). - Call for new or changing lesions.  Lentigines - Scattered tan macules - Due to sun exposure - Benign-appering, observe - Recommend daily broad spectrum sunscreen SPF 30+ to sun-exposed areas, reapply every 2 hours as needed. - Call for any changes  Seborrheic Keratoses - Stuck-on, waxy, tan-brown  papules and/or plaques  - Benign-appearing - Discussed benign etiology and prognosis. - Observe - Call for any changes  History of Basal Cell Carcinoma of the Skin - No evidence of recurrence today of the left dorsal nose, L nasal tip, R med eyebrow, R paranasal above nasal ala - Recommend regular full body skin exams - Recommend daily broad spectrum sunscreen SPF 30+ to sun-exposed areas, reapply every 2 hours as needed.  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 Larger vessels may be covered if symptomatic and we would refer to vascular surgeon if treatment desired.    Excoriation Left Upper Back  Benign, observe.   Avoid picking/scratching.    Inflamed seborrheic keratosis (3) Right Mid Forearm  vs Prurigo Nodules  Destruction of lesion - Right Mid Forearm  Destruction method: cryotherapy   Informed consent: discussed and consent obtained   Lesion destroyed using liquid nitrogen: Yes   Region frozen until ice ball extended beyond lesion: Yes   Outcome: patient tolerated procedure well with no complications   Post-procedure details: wound care instructions given   Additional details:  Prior to procedure, discussed risks of blister formation, small wound, skin dyspigmentation,  or rare scar following cryotherapy. Recommend Vaseline ointment to treated areas while healing.    Return in about 1 year (around 07/28/2022) for Hx BCC, TBSE.  IJamesetta Orleans, CMA, am acting as scribe for [name], MD . Documentation: I have reviewed the above documentation for  accuracy and completeness, and I agree with the above.  [name] MD

## 2021-07-28 NOTE — Patient Instructions (Addendum)

## 2021-09-04 ENCOUNTER — Other Ambulatory Visit: Payer: Self-pay | Admitting: Internal Medicine

## 2021-09-04 DIAGNOSIS — Z1231 Encounter for screening mammogram for malignant neoplasm of breast: Secondary | ICD-10-CM

## 2021-10-13 ENCOUNTER — Ambulatory Visit
Admission: RE | Admit: 2021-10-13 | Discharge: 2021-10-13 | Disposition: A | Discharge status: Home / Self Care | Payer: Medicare Other | Source: Ambulatory Visit | Attending: Internal Medicine | Admitting: Internal Medicine

## 2021-10-13 DIAGNOSIS — Z1231 Encounter for screening mammogram for malignant neoplasm of breast: Secondary | ICD-10-CM | POA: Insufficient documentation

## 2022-07-14 ENCOUNTER — Other Ambulatory Visit: Payer: Self-pay | Admitting: Internal Medicine

## 2022-07-14 DIAGNOSIS — R600 Localized edema: Secondary | ICD-10-CM

## 2022-07-15 ENCOUNTER — Ambulatory Visit
Admission: RE | Admit: 2022-07-15 | Discharge: 2022-07-15 | Disposition: A | Discharge status: Home / Self Care | Payer: Medicare Other | Source: Ambulatory Visit | Attending: Internal Medicine | Admitting: Internal Medicine

## 2022-07-15 DIAGNOSIS — R600 Localized edema: Secondary | ICD-10-CM | POA: Insufficient documentation

## 2022-08-02 ENCOUNTER — Ambulatory Visit: Payer: Medicare Other | Admitting: Dermatology

## 2023-09-10 DEATH — deceased
